# Patient Record
Sex: Male | Born: 1960 | Race: White | Hispanic: No | Marital: Single | Smoking: Never smoker
Health system: Southern US, Community
[De-identification: ages and names within clinical notes are randomized; demographics above are authoritative.]

## PROBLEM LIST (undated history)

## (undated) DIAGNOSIS — G43909 Migraine, unspecified, not intractable, without status migrainosus: Secondary | ICD-10-CM

## (undated) DIAGNOSIS — T7840XA Allergy, unspecified, initial encounter: Secondary | ICD-10-CM

## (undated) DIAGNOSIS — M720 Palmar fascial fibromatosis [Dupuytren]: Secondary | ICD-10-CM

## (undated) HISTORY — DX: Palmar fascial fibromatosis (dupuytren): M72.0

## (undated) HISTORY — DX: Allergy, unspecified, initial encounter: T78.40XA

## (undated) HISTORY — PX: OTHER SURGICAL HISTORY: SHX169

## (undated) HISTORY — DX: Migraine, unspecified, not intractable, without status migrainosus: G43.909

---

## 2009-01-08 ENCOUNTER — Emergency Department: Payer: Self-pay | Admitting: Internal Medicine

## 2010-07-21 ENCOUNTER — Ambulatory Visit: Payer: Self-pay | Admitting: Internal Medicine

## 2010-08-11 ENCOUNTER — Encounter: Payer: Self-pay | Admitting: Specialist

## 2010-09-02 ENCOUNTER — Encounter: Payer: Self-pay | Admitting: Specialist

## 2011-11-12 ENCOUNTER — Ambulatory Visit: Payer: Self-pay | Admitting: Internal Medicine

## 2012-07-07 ENCOUNTER — Ambulatory Visit: Payer: Self-pay | Admitting: Family Medicine

## 2012-11-01 ENCOUNTER — Ambulatory Visit: Payer: Self-pay | Admitting: Family Medicine

## 2012-11-24 ENCOUNTER — Ambulatory Visit: Payer: Self-pay | Admitting: Specialist

## 2013-11-23 ENCOUNTER — Ambulatory Visit: Payer: Self-pay

## 2013-11-23 LAB — RAPID INFLUENZA A&B ANTIGENS

## 2015-09-19 DIAGNOSIS — S6390XA Sprain of unspecified part of unspecified wrist and hand, initial encounter: Secondary | ICD-10-CM | POA: Insufficient documentation

## 2015-09-19 DIAGNOSIS — M720 Palmar fascial fibromatosis [Dupuytren]: Secondary | ICD-10-CM | POA: Insufficient documentation

## 2016-04-22 ENCOUNTER — Encounter: Payer: Self-pay | Admitting: Unknown Physician Specialty

## 2016-04-22 ENCOUNTER — Ambulatory Visit (INDEPENDENT_AMBULATORY_CARE_PROVIDER_SITE_OTHER): Payer: 59 | Admitting: Unknown Physician Specialty

## 2016-04-22 VITALS — BP 133/82 | HR 56 | Temp 98.1°F | Ht 69.7 in | Wt 163.4 lb

## 2016-04-22 DIAGNOSIS — M720 Palmar fascial fibromatosis [Dupuytren]: Secondary | ICD-10-CM

## 2016-04-22 DIAGNOSIS — M7661 Achilles tendinitis, right leg: Secondary | ICD-10-CM | POA: Insufficient documentation

## 2016-04-22 NOTE — Progress Notes (Signed)
   BP 133/82 mmHg  Pulse 56  Temp(Src) 98.1 F (36.7 C)  Ht 5' 9.7" (1.77 m)  Wt 163 lb 6.4 oz (74.118 kg)  BMI 23.66 kg/m2  SpO2 100%   Subjective:    Patient ID: Stanley Ritter, male    DOB: 09/29/1961, 55 y.o.   MRN: [id number]  HPI: Stanley CanningRobert C Dunaway is a 55 y.o. male  Chief Complaint  Patient presents with  . Pain    pt states he is having pain in achilles area of right foot and around to the top of foot. 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  Started having pain in right foot suddenly 3 days ago and found himself unable to stand.  Today having pain along the achilles tendon track.  Just sitting there no pain, but starts hurting when walking on it.    Relevant past medical, surgical, family and social history reviewed and updated as indicated. Interim medical history since our last visit reviewed. Allergies and medications reviewed and updated.  Review of Systems  Per HPI unless specifically indicated above     Objective:    BP 133/82 mmHg  Pulse 56  Temp(Src) 98.1 F (36.7 C)  Ht 5' 9.7" (1.77 m)  Wt 163 lb 6.4 oz (74.118 kg)  BMI 23.66 kg/m2  SpO2 100%  Wt Readings from Last 3 Encounters:  04/22/16 163 lb 6.4 oz (74.118 kg)  01/10/15 159 lb (72.122 kg)    Physical Exam  Constitutional: He is oriented to person, place, and time. He appears well-developed and well-nourished. No distress.  HENT:  Head: Normocephalic and atraumatic.  Eyes: Conjunctivae and lids are normal. Right eye exhibits no discharge. Left eye exhibits no discharge. No scleral icterus.  Cardiovascular: Normal rate.   Pulmonary/Chest: Effort normal.  Abdominal: Normal appearance. There is no splenomegaly or hepatomegaly.  Musculoskeletal: Normal range of motion.       Right ankle: He exhibits normal range of motion, no swelling, no ecchymosis, no deformity, no laceration and  normal pulse. Tenderness. Achilles tendon exhibits pain. Achilles tendon exhibits no defect.  Tender at achilles insertion sites  Neurological: He is alert and oriented to person, place, and time.  Skin: Skin is intact. No rash noted. No pallor.  Psychiatric: He has a normal mood and affect. His behavior is normal. Judgment and thought content normal.       Assessment & Plan:   Problem List Items Addressed This Visit      Unprioritized   Tendonitis, Achilles, right - Primary    No swelling and I don't suspect a rupture.  Will decrease training and note to be out of work for a week.            Follow up plan: Return in about 1 week (around 04/29/2016).

## 2016-04-22 NOTE — Assessment & Plan Note (Signed)
No swelling and I don't suspect a rupture.  Will decrease training and note to be out of work for a week.

## 2016-04-29 ENCOUNTER — Ambulatory Visit (INDEPENDENT_AMBULATORY_CARE_PROVIDER_SITE_OTHER): Payer: 59 | Admitting: Unknown Physician Specialty

## 2016-04-29 ENCOUNTER — Encounter: Payer: Self-pay | Admitting: Unknown Physician Specialty

## 2016-04-29 VITALS — BP 140/91 | HR 67 | Temp 97.6°F | Ht 70.0 in | Wt 163.2 lb

## 2016-04-29 DIAGNOSIS — M7661 Achilles tendinitis, right leg: Secondary | ICD-10-CM | POA: Diagnosis not present

## 2016-04-29 NOTE — Progress Notes (Signed)
BP 140/91 mmHg  Pulse 67  Temp(Src) 97.6 F (36.4 C)  Ht 5\' 10"  (1.778 m)  Wt 163 lb 3.2 oz (74.027 kg)  BMI 23.42 kg/m2  SpO2 98%   Subjective:    Patient ID: Stanley Ritter, male    DOB: 12/25/1960, 55 y.o.   MRN: [id number]  HPI: Stanley Ritter is a 55 y.o. male  Chief Complaint  Patient presents with  . Tendonitis    pt is here for 1 week f/up    Pt is here for f/u of tendonitis.  Pt states he is doing better.  However his medial ankle is bothering and he now remembers that the achilles tendonitis started after treatment of medial tibial tendonitis and ? Stress fracture of medial malleolus.  He states the achilles is better but has done nothing all week.    Relevant past medical, surgical, family and social history reviewed and updated as indicated. Interim medical history since our last visit reviewed. Allergies and medications reviewed and updated.  Review of Systems  Per HPI unless specifically indicated above     Objective:    BP 140/91 mmHg  Pulse 67  Temp(Src) 97.6 F (36.4 C)  Ht 5\' 10"  (1.778 m)  Wt 163 lb 3.2 oz (74.027 kg)  BMI 23.42 kg/m2  SpO2 98%  Wt Readings from Last 3 Encounters:  04/29/16 163 lb 3.2 oz (74.027 kg)  04/22/16 163 lb 6.4 oz (74.118 kg)  01/10/15 159 lb (72.122 kg)    Physical Exam  Constitutional: He is oriented to person, place, and time. He appears well-developed and well-nourished. No distress.  HENT:  Head: Normocephalic and atraumatic.  Eyes: Conjunctivae and lids are normal. Right eye exhibits no discharge. Left eye exhibits no discharge. No scleral icterus.  Neck: Normal range of motion. Neck supple. No JVD present. Carotid bruit is not present.  Pulmonary/Chest: Effort normal and breath sounds normal. No respiratory distress.  Abdominal: Normal appearance. There is no splenomegaly or hepatomegaly.  Musculoskeletal: Normal range of motion.  Neurological: He is alert and oriented to person, place, and time.  Skin:  Skin is warm, dry and intact. No rash noted. No pallor.  Psychiatric: He has a normal mood and affect. His behavior is normal. Judgment and thought content normal.  Filled out paperwork   Results for orders placed or performed in visit on 11/23/13  Influenza A&B Antigens [facility])  Result Value Ref Range   Micro Text Report         SOURCE: NASO    COMMENT                   POSITIVE FOR INFLUENZA A (ANTIGEN PRESENT)   COMMENT                   NEGATIVE FOR INFLUENZA B (ANTIGEN ABSENT)   ANTIBIOTIC                                                          Assessment & Plan:   Problem List Items Addressed This Visit      Unprioritized   Tendonitis, Achilles, right - Primary   Relevant Orders   Ambulatory referral to Physical Therapy       Follow up plan: Return for refer to PT.

## 2016-04-30 ENCOUNTER — Telehealth: Payer: Self-pay | Admitting: Unknown Physician Specialty

## 2016-04-30 NOTE — Telephone Encounter (Signed)
Pt called stated he needs a referral to see a physical therapist about his foot. [facility] [place] Please call pt with any questions. Office fax # [phone] for PT. Thanks

## 2016-05-01 NOTE — Telephone Encounter (Signed)
Routing to provider  

## 2016-05-01 NOTE — Telephone Encounter (Signed)
Routing to referrals

## 2016-05-01 NOTE — Telephone Encounter (Signed)
I think I did a referral when I saw him

## 2016-11-06 ENCOUNTER — Ambulatory Visit: Payer: 59 | Admitting: Family Medicine

## 2016-11-17 ENCOUNTER — Encounter: Payer: Self-pay | Admitting: Family Medicine

## 2016-11-17 ENCOUNTER — Ambulatory Visit (INDEPENDENT_AMBULATORY_CARE_PROVIDER_SITE_OTHER): Payer: 59 | Admitting: Family Medicine

## 2016-11-17 DIAGNOSIS — S060X0A Concussion without loss of consciousness, initial encounter: Secondary | ICD-10-CM | POA: Diagnosis not present

## 2016-11-17 DIAGNOSIS — S060X9A Concussion with loss of consciousness of unspecified duration, initial encounter: Secondary | ICD-10-CM | POA: Insufficient documentation

## 2016-11-17 DIAGNOSIS — S060XAA Concussion with loss of consciousness status unknown, initial encounter: Secondary | ICD-10-CM

## 2016-11-17 HISTORY — DX: Concussion with loss of consciousness status unknown, initial encounter: S06.0XAA

## 2016-11-17 NOTE — Assessment & Plan Note (Addendum)
Discuss concussion care and considerations will stay out of work until dizziness resolves because of ladder climbing required. May need further evaluation or longer staying out of work until symptoms resolve.

## 2016-11-17 NOTE — Progress Notes (Signed)
   BP 117/75   Pulse 67   Temp 98.6 F (37 C) (Oral)   Ht 5\' 9"  (1.753 m)   Wt 166 lb (75.3 kg)   SpO2 99%   BMI 24.51 kg/m    Subjective:    Patient ID: Stanley Ritter, male    DOB: 10/19/1961, 56 y.o.   MRN: [id number]  HPI: Stanley Ritter is a 56 y.o. male  Chief Complaint  Patient presents with  . Hospitalization Follow-up  . Concussion    Migraines, dizziness   Patient with head injury from fall while ice skating January 2 still having some issues with dizziness lightheaded patient works in a job with Therapist, musicclimbing ladders. Still having some migraine headaches also along with dizziness.  Relevant past medical, surgical, family and social history reviewed and updated as indicated. Interim medical history since our last visit reviewed. Allergies and medications reviewed and updated.  Review of Systems  Constitutional: Negative.   Respiratory: Negative.   Cardiovascular: Negative.     Per HPI unless specifically indicated above     Objective:    BP 117/75   Pulse 67   Temp 98.6 F (37 C) (Oral)   Ht 5\' 9"  (1.753 m)   Wt 166 lb (75.3 kg)   SpO2 99%   BMI 24.51 kg/m   Wt Readings from Last 3 Encounters:  11/17/16 166 lb (75.3 kg)  04/29/16 163 lb 3.2 oz (74 kg)  04/22/16 163 lb 6.4 oz (74.1 kg)    Physical Exam  Constitutional: He is oriented to person, place, and time. He appears well-developed and well-nourished. No distress.  HENT:  Head: Normocephalic and atraumatic.  Right Ear: Hearing normal.  Left Ear: Hearing normal.  Nose: Nose normal.  Eyes: Conjunctivae and lids are normal. Right eye exhibits no discharge. Left eye exhibits no discharge. No scleral icterus.  Cardiovascular: Normal rate, regular rhythm and normal heart sounds.   Pulmonary/Chest: Effort normal and breath sounds normal. No respiratory distress.  Musculoskeletal: Normal range of motion.  Neurological: He is alert and oriented to person, place, and time. He displays normal reflexes.  No cranial nerve deficit. He exhibits normal muscle tone. Coordination normal.  Skin: Skin is intact. No rash noted.  Psychiatric: He has a normal mood and affect. His speech is normal and behavior is normal. Judgment and thought content normal. Cognition and memory are normal.        Assessment & Plan:   Problem List Items Addressed This Visit      Other   Concussion    Discuss concussion care and considerations will stay out of work until dizziness resolves because of ladder climbing required. May need further evaluation or longer staying out of work until symptoms resolve.          Follow up plan: Return if symptoms worsen or fail to improve.

## 2016-11-23 ENCOUNTER — Telehealth: Payer: Self-pay | Admitting: Family Medicine

## 2016-11-23 NOTE — Telephone Encounter (Signed)
OK to give work note from 1/16-1/23- if he is still having issues with staying awake, will need to be seen in follow up ASAP

## 2016-11-23 NOTE — Telephone Encounter (Signed)
Letter composed and faxed to [phone] Attn: Plant Nurse. At patient's request. He will call tomorrow for an acute visit if still not feeling better.

## 2016-11-23 NOTE — Telephone Encounter (Signed)
OV from 11/17/16 was reviewed did not see any mention of alertness issues. A&P did however state, " Concussion     Discuss concussion care and considerations will stay out of work until dizziness resolves because of ladder climbing required. May need further evaluation or longer staying out of work until symptoms resolve.    "  Please advise on work note.

## 2016-11-23 NOTE — Telephone Encounter (Signed)
Pt called and stated that he would like to get a note for work. He is unable to stay awake for more than 3 hours. See office visit 11/17/16.

## 2016-11-28 ENCOUNTER — Encounter: Payer: Self-pay | Admitting: Family Medicine

## 2016-11-30 NOTE — Telephone Encounter (Signed)
Please see message from patient. Pt requested a note on 11/23/16, Dr. Laural BenesJohnson did okay writing note from 11/17/16-11/24/16 and advised that patient should follow up after 11/24/16 if he continued to have issues staying awake. Pt now requesting short term disability form to be completed, please advise.

## 2017-09-06 ENCOUNTER — Encounter: Payer: Self-pay | Admitting: Unknown Physician Specialty

## 2017-09-06 ENCOUNTER — Ambulatory Visit (INDEPENDENT_AMBULATORY_CARE_PROVIDER_SITE_OTHER): Payer: 59 | Admitting: Unknown Physician Specialty

## 2017-09-06 VITALS — BP 119/78 | HR 80 | Temp 99.1°F | Wt 168.0 lb

## 2017-09-06 DIAGNOSIS — R05 Cough: Secondary | ICD-10-CM | POA: Diagnosis not present

## 2017-09-06 DIAGNOSIS — R059 Cough, unspecified: Secondary | ICD-10-CM

## 2017-09-06 LAB — VERITOR FLU A/B WAIVED
Influenza A: NEGATIVE
Influenza B: NEGATIVE

## 2017-09-06 MED ORDER — AZITHROMYCIN 250 MG PO TABS: ORAL_TABLET | ORAL | 0 refills | Status: DC

## 2017-09-06 MED ORDER — HYDROCOD POLST-CPM POLST ER 10-8 MG/5ML PO SUER: 5.0000 mL | Freq: Two times a day (BID) | ORAL | 0 refills | Status: DC | PRN

## 2017-09-06 NOTE — Progress Notes (Signed)
   BP 119/78   Pulse 80   Temp 99.1 F (37.3 C) (Oral)   Wt 168 lb (76.2 kg)   SpO2 99%   BMI 24.81 kg/m    Subjective:    Patient ID: Stanley Ritter, male    DOB: 01/30/1961, 56 y.o.   MRN: [id number]  HPI: Stanley CanningRobert C Ellis is a 56 y.o. male  Chief Complaint  Patient presents with  . URI    pt states he has had a cough since last Wednesday. States he started feeling really bad 2 days ago    URI   This is a new problem. The current episode started in the past 7 days. The problem has been rapidly worsening. Maximum temperature: Did not check. Associated symptoms include congestion and coughing. Associated symptoms comments: Chills.  Cough is severe. He has tried decongestant and antihistamine (cough meds) for the symptoms.    Relevant past medical, surgical, family and social history reviewed and updated as indicated. Interim medical history since our last visit reviewed. Allergies and medications reviewed and updated.  Review of Systems  HENT: Positive for congestion.   Respiratory: Positive for cough.     Per HPI unless specifically indicated above     Objective:    BP 119/78   Pulse 80   Temp 99.1 F (37.3 C) (Oral)   Wt 168 lb (76.2 kg)   SpO2 99%   BMI 24.81 kg/m   Wt Readings from Last 3 Encounters:  09/06/17 168 lb (76.2 kg)  11/17/16 166 lb (75.3 kg)  04/29/16 163 lb 3.2 oz (74 kg)    Physical Exam  Constitutional: He is oriented to person, place, and time. He appears well-developed and well-nourished. No distress.  HENT:  Head: Normocephalic and atraumatic.  Right Ear: Tympanic membrane and ear canal normal.  Left Ear: Tympanic membrane and ear canal normal.  Nose: Rhinorrhea present. Right sinus exhibits no maxillary sinus tenderness and no frontal sinus tenderness. Left sinus exhibits no maxillary sinus tenderness and no frontal sinus tenderness.  Mouth/Throat: Uvula is midline. Posterior oropharyngeal edema present.  Eyes: Conjunctivae and lids are  normal. Right eye exhibits no discharge. Left eye exhibits no discharge. No scleral icterus.  Neck: Neck supple.  Cardiovascular: Normal rate, regular rhythm and normal heart sounds.  Pulmonary/Chest: Effort normal and breath sounds normal. No respiratory distress.  Abdominal: Normal appearance. There is no splenomegaly or hepatomegaly.  Musculoskeletal: Normal range of motion.  Neurological: He is alert and oriented to person, place, and time.  Skin: Skin is warm, dry and intact. No rash noted. No pallor.  Psychiatric: He has a normal mood and affect. His behavior is normal. Judgment and thought content normal.  Nursing note and vitals reviewed.   Results for orders placed or performed in visit on 09/06/17  Veritor Flu A/B Waived  Result Value Ref Range   Influenza A Negative Negative   Influenza B Negative Negative      Assessment & Plan:   Problem List Items Addressed This Visit    None    Visit Diagnoses    Cough    -  Primary   Rx for Zithromax due to fever and negative flu.  Rx for Tussinex for cough.     Relevant Orders   Veritor Flu A/B Waived (Completed)      Note to be out of work until Thursday  Follow up plan: Return if symptoms worsen or fail to improve.

## 2017-09-08 ENCOUNTER — Ambulatory Visit
Admission: RE | Admit: 2017-09-08 | Discharge: 2017-09-08 | Disposition: A | Discharge status: Home / Self Care | Payer: 59 | Source: Ambulatory Visit | Attending: Unknown Physician Specialty | Admitting: Unknown Physician Specialty

## 2017-09-08 ENCOUNTER — Encounter: Payer: Self-pay | Admitting: Unknown Physician Specialty

## 2017-09-08 ENCOUNTER — Ambulatory Visit (INDEPENDENT_AMBULATORY_CARE_PROVIDER_SITE_OTHER): Payer: 59 | Admitting: Unknown Physician Specialty

## 2017-09-08 VITALS — BP 121/84 | HR 68 | Temp 98.3°F | Wt 165.2 lb

## 2017-09-08 DIAGNOSIS — J111 Influenza due to unidentified influenza virus with other respiratory manifestations: Secondary | ICD-10-CM

## 2017-09-08 DIAGNOSIS — R05 Cough: Secondary | ICD-10-CM | POA: Insufficient documentation

## 2017-09-08 DIAGNOSIS — R059 Cough, unspecified: Secondary | ICD-10-CM

## 2017-09-08 MED ORDER — ALBUTEROL SULFATE HFA 108 (90 BASE) MCG/ACT IN AERS: 2.0000 | INHALATION_SPRAY | Freq: Four times a day (QID) | RESPIRATORY_TRACT | 0 refills | Status: DC | PRN

## 2017-09-08 NOTE — Progress Notes (Signed)
   BP 121/84   Pulse 68   Temp 98.3 F (36.8 C) (Oral)   Wt 165 lb 3.2 oz (74.9 kg)   SpO2 98%   BMI 24.40 kg/m    Subjective:    Patient ID: Stanley Ritter, male    DOB: 12/06/1960, [age] y.o.   MRN: [id number]  HPI: Stanley Ritter is a 56 y.o. male  Chief Complaint  Patient presents with  . URI    2 day f/up.Marland Kitchen.pt states he is doing a little better   Seen 11/5 Pt states he is doing a little better.  He states he has had a fever that last 2 days but seemed to break this AM.  I gave him a Z pack last visit.    Relevant past medical, surgical, family and social history reviewed and updated as indicated. Interim medical history since our last visit reviewed. Allergies and medications reviewed and updated.  Review of Systems  Per HPI unless specifically indicated above     Objective:    BP 121/84   Pulse 68   Temp 98.3 F (36.8 C) (Oral)   Wt 165 lb 3.2 oz (74.9 kg)   SpO2 98%   BMI 24.40 kg/m   Wt Readings from Last 3 Encounters:  09/08/17 165 lb 3.2 oz (74.9 kg)  09/06/17 168 lb (76.2 kg)  11/17/16 166 lb (75.3 kg)    Physical Exam  Constitutional: He is oriented to person, place, and time. He appears well-developed and well-nourished. No distress.  HENT:  Head: Normocephalic and atraumatic.  Right Ear: Tympanic membrane and ear canal normal.  Left Ear: Tympanic membrane and ear canal normal.  Nose: Rhinorrhea present. Right sinus exhibits no maxillary sinus tenderness and no frontal sinus tenderness. Left sinus exhibits no maxillary sinus tenderness and no frontal sinus tenderness.  Mouth/Throat: Uvula is midline. Posterior oropharyngeal edema present.  Eyes: Conjunctivae and lids are normal. Right eye exhibits no discharge. Left eye exhibits no discharge. No scleral icterus.  Neck: Neck supple.  Cardiovascular: Normal rate, regular rhythm and normal heart sounds.  Pulmonary/Chest: Effort normal. No respiratory distress. He has wheezes in the right lower field and  the left lower field.  Abdominal: Normal appearance. There is no splenomegaly or hepatomegaly.  Musculoskeletal: Normal range of motion.  Neurological: He is alert and oriented to person, place, and time.  Skin: Skin is warm, dry and intact. No rash noted. No pallor.  Psychiatric: He has a normal mood and affect. His behavior is normal. Judgment and thought content normal.  Nursing note and vitals reviewed.   Results for orders placed or performed in visit on 09/06/17  Veritor Flu A/B Waived  Result Value Ref Range   Influenza A Negative Negative   Influenza B Negative Negative      Assessment & Plan:   Problem List Items Addressed This Visit    None    Visit Diagnoses    Cough    -  Primary   Continue woth Tussionex prn.  Add Albuterol prn due to wheeze   Relevant Orders   DG Chest 2 View   Influenza       suspect influenza.  discussed supportive care       Follow up plan: Return if symptoms worsen or fail to improve.

## 2018-07-13 ENCOUNTER — Ambulatory Visit (INDEPENDENT_AMBULATORY_CARE_PROVIDER_SITE_OTHER): Payer: 59 | Admitting: Family Medicine

## 2018-07-13 ENCOUNTER — Encounter: Payer: Self-pay | Admitting: Family Medicine

## 2018-07-13 VITALS — BP 146/89 | HR 71 | Wt 167.0 lb

## 2018-07-13 DIAGNOSIS — S76212A Strain of adductor muscle, fascia and tendon of left thigh, initial encounter: Secondary | ICD-10-CM

## 2018-07-13 MED ORDER — TRIAMCINOLONE ACETONIDE 40 MG/ML IJ SUSP
40.0000 mg | Freq: Once | INTRAMUSCULAR | Status: AC
  Administered 2018-07-13: 40 mg via INTRAMUSCULAR

## 2018-07-13 MED ORDER — TRAMADOL HCL 50 MG PO TABS: 50.0000 mg | ORAL_TABLET | Freq: Every evening | ORAL | 0 refills | Status: DC | PRN

## 2018-07-13 MED ORDER — CYCLOBENZAPRINE HCL 10 MG PO TABS: 10.0000 mg | ORAL_TABLET | Freq: Every evening | ORAL | 0 refills | Status: DC | PRN

## 2018-07-13 NOTE — Progress Notes (Signed)
   BP (!) 146/89   Pulse 71   Wt 167 lb (75.8 kg)   SpO2 100%   BMI 24.66 kg/m    Subjective:    Patient ID: Stanley Ritter, male    DOB: July 16, 1961, 57 y.o.   MRN: [id number]  HPI: [name] is a 57 y.o. male  Chief Complaint  Patient presents with  . Back Pain  . 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 Denies bruising, swelling. Low back originally hurt but that has resolved with rest. Taking advil, doing epsom salt soaks, and resting with some relief.   Relevant past medical, surgical, family and social history reviewed and updated as indicated. Interim medical history since our last visit reviewed. Allergies and medications reviewed and updated.  Review of Systems  Per HPI unless specifically indicated above     Objective:    BP (!) 146/89   Pulse 71   Wt 167 lb (75.8 kg)   SpO2 100%   BMI 24.66 kg/m   Wt Readings from Last 3 Encounters:  07/13/18 167 lb (75.8 kg)  09/08/17 165 lb 3.2 oz (74.9 kg)  09/06/17 168 lb (76.2 kg)    Physical Exam  Constitutional: He is oriented to person, place, and time. He appears well-developed and well-nourished.  HENT:  Head: Atraumatic.  Eyes: Conjunctivae and EOM are normal.  Neck: Normal range of motion. Neck supple.  Cardiovascular: Normal rate and regular rhythm.  Pulmonary/Chest: Effort normal and breath sounds normal.  Musculoskeletal: Normal range of motion. He exhibits tenderness (left groin ttp extending laterally into left hip). He exhibits no edema.  Neurological: He is alert and oriented to person, place, and time.  Skin: Skin is warm and dry. No erythema.  Nursing note and vitals reviewed.   Results for orders placed or performed in visit on 09/06/17  Veritor Flu A/B Waived  Result Value Ref Range   Influenza A  Negative Negative   Influenza B Negative Negative      Assessment & Plan:   Problem List Items Addressed This Visit    None    Visit Diagnoses    Groin strain, left, initial encounter    -  Primary   IM kenalog today, flexeril prn as well as rest, stretches, massage. Can continue prn OTC pain relievers and use tramadol prn for severe pain   Relevant Medications   triamcinolone acetonide (KENALOG-40) injection 40 mg (Completed)       Follow up plan: Return if symptoms worsen or fail to improve.

## 2018-07-24 NOTE — Patient Instructions (Signed)
Follow up as needed

## 2018-11-09 ENCOUNTER — Ambulatory Visit: Payer: 59 | Admitting: Nurse Practitioner

## 2019-01-12 ENCOUNTER — Encounter: Payer: Self-pay | Admitting: Emergency Medicine

## 2019-01-12 ENCOUNTER — Ambulatory Visit
Admission: EM | Admit: 2019-01-12 | Discharge: 2019-01-12 | Disposition: A | Discharge status: Home / Self Care | Payer: 59 | Attending: Emergency Medicine | Admitting: Emergency Medicine

## 2019-01-12 ENCOUNTER — Other Ambulatory Visit: Payer: Self-pay

## 2019-01-12 ENCOUNTER — Ambulatory Visit: Payer: Self-pay | Admitting: *Deleted

## 2019-01-12 DIAGNOSIS — R509 Fever, unspecified: Secondary | ICD-10-CM

## 2019-01-12 DIAGNOSIS — B9789 Other viral agents as the cause of diseases classified elsewhere: Secondary | ICD-10-CM | POA: Diagnosis not present

## 2019-01-12 DIAGNOSIS — M791 Myalgia, unspecified site: Secondary | ICD-10-CM | POA: Diagnosis not present

## 2019-01-12 DIAGNOSIS — R69 Illness, unspecified: Secondary | ICD-10-CM | POA: Diagnosis not present

## 2019-01-12 DIAGNOSIS — J111 Influenza due to unidentified influenza virus with other respiratory manifestations: Secondary | ICD-10-CM

## 2019-01-12 DIAGNOSIS — R6883 Chills (without fever): Secondary | ICD-10-CM | POA: Diagnosis not present

## 2019-01-12 LAB — RAPID INFLUENZA A&B ANTIGENS (ARMC ONLY)
INFLUENZA A (ARMC): NEGATIVE
INFLUENZA B (ARMC): NEGATIVE

## 2019-01-12 MED ORDER — OSELTAMIVIR PHOSPHATE 75 MG PO CAPS: 75.0000 mg | ORAL_CAPSULE | Freq: Two times a day (BID) | ORAL | 0 refills | Status: DC

## 2019-01-12 NOTE — ED Provider Notes (Signed)
[facility]    CSN: [id number] Arrival date & time: 01/12/19  1359     History   Chief Complaint Chief Complaint  Patient presents with  . Generalized Body Aches  . Fever    HPI Stanley Ritter is a 58 y.o. male.   Patient is a 58 year old male who presents complaining of chills and fever that started 2 days ago.  Patient reports she has had flu this season already but states symptoms with a little bit different.  Reports body aches in the back and calves.  Minimal cough that is nonproductive.  Patient denies any sneezing, runny nose or sore throat.  Patient reports a temperature 101.3 last night which she took ibuprofen for.  States he had a temp of 100.3 this morning.  States she is only taking medications for temperatures above 101.  Patient denies any chest pain or shortness of breath.  And responsive his back pain, patient denies any frequency, painful urination or other urinary symptoms.  Patient does report that he was part of a figure skating competition this past weekend in [place].  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  Patient has had no other sick contacts.     Past Medical History:  Diagnosis Date  . Allergy   . Dupuytren's contracture   . Migraines     Patient Active Problem List   Diagnosis Date Noted  . Concussion 11/17/2016  . Tendonitis, Achilles, right 04/22/2016  . Dupuytren's contracture 09/19/2015  . Sprain of hand 09/19/2015    Past Surgical History:  Procedure Laterality Date  . dental implant     x2       Home Medications    Prior to Admission medications   Medication Sig Start Date End Date Taking? Authorizing Provider  Coenzyme Q10 (CO Q 10 PO) Take 1 tablet by mouth daily.   Yes [provider]  Multiple Vitamin (MULTI-VITAMINS) TABS Take 1 tablet by mouth daily.   Yes [provider]  Omega-3 Fatty Acids (FISH OIL PO) Take by mouth daily.   Yes [provider]  triamcinolone  (NASACORT AQ) 55 MCG/ACT AERO nasal inhaler Place 2 sprays into the nose daily.   Yes [provider]  albuterol (PROVENTIL HFA;VENTOLIN HFA) 108 (90 Base) MCG/ACT inhaler Inhale 2 puffs every 6 (six) hours as needed into the lungs for wheezing or shortness of breath. 09/08/17   Gabriel Cirri, NP  cyclobenzaprine (FLEXERIL) 10 MG tablet Take 1 tablet (10 mg total) by mouth at bedtime as needed for muscle spasms. 07/13/18   Particia Nearing, PA-C  oseltamivir (TAMIFLU) 75 MG capsule Take 1 capsule (75 mg total) by mouth every 12 (twelve) hours. 01/12/19   Candis Schatz, PA-C  SUMAtriptan (IMITREX) 100 MG tablet Take 100 mg by mouth every 2 (two) hours as needed for migraine. May repeat in 2 hours if headache persists or recurs.    [provider]  traMADol (ULTRAM) 50 MG tablet Take 1 tablet (50 mg total) by mouth at bedtime as needed. 07/13/18   Particia Nearing, PA-C    Family History Family History  Problem Relation Age of Onset  . Stroke Maternal Grandmother   . Heart disease Mother   . Glaucoma Mother   . Hypertension Mother   . Heart disease Father   . Glaucoma Father   . Hypertension Father   . Hypertension Sister   . Hypertension Brother   . Diabetes Maternal Grandfather   .  Hypertension Brother     Social History Social History   Tobacco Use  . Smoking status: Never Smoker  . Smokeless tobacco: Never Used  Substance Use Topics  . Alcohol use: No    Alcohol/week: 0.0 standard drinks  . Drug use: No     Allergies   Patient has no known allergies.   Review of Systems Review of Systems  As noted above in HPI.  Other systems reviewed and found to be negative.   Physical Exam Triage Vital Signs ED Triage Vitals  Enc Vitals Group     BP 01/12/19 1458 (!) 120/95     Pulse Rate 01/12/19 1458 70     Resp 01/12/19 1458 16     Temp 01/12/19 1458 98.6 F (37 C)     Temp Source 01/12/19 1458 Oral     SpO2 01/12/19 1458 99 %     Weight  01/12/19 1459 163 lb (73.9 kg)     Height 01/12/19 1459 5\' 9"  (1.753 m)     Head Circumference --      Peak Flow --      Pain Score 01/12/19 1458 4     Pain Loc --      Pain Edu? --      Excl. in GC? --    No data found.  Updated Vital Signs BP (!) 120/95 (BP Location: Left Arm)   Pulse 70   Temp 98.6 F (37 C) (Oral)   Resp 16   Ht 5\' 9"  (1.753 m)   Wt 163 lb (73.9 kg)   SpO2 99%   BMI 24.07 kg/m   Visual Acuity Right Eye Distance:   Left Eye Distance:   Bilateral Distance:    Right Eye Near:   Left Eye Near:    Bilateral Near:     Physical Exam Constitutional:      Appearance: Normal appearance. He is ill-appearing.  HENT:     Right Ear: Tympanic membrane and ear canal normal.     Left Ear: Tympanic membrane and ear canal normal.     Mouth/Throat:     Mouth: Mucous membranes are moist.  Eyes:     Extraocular Movements: Extraocular movements intact.  Cardiovascular:     Rate and Rhythm: Normal rate and regular rhythm.     Pulses: Normal pulses.     Heart sounds: Normal heart sounds.  Pulmonary:     Effort: Pulmonary effort is normal.     Breath sounds: Normal breath sounds.  Musculoskeletal: Normal range of motion.  Skin:    General: Skin is warm and dry.  Neurological:     General: No focal deficit present.     Mental Status: He is alert and oriented to person, place, and time.  Psychiatric:        Mood and Affect: Mood normal.        Behavior: Behavior normal.      UC Treatments / Results  Labs (all labs ordered are listed, but only abnormal results are displayed) Labs Reviewed  RAPID INFLUENZA A&B ANTIGENS ([facility] ONLY)    EKG None  Radiology No results found.  Procedures Procedures (including critical care time)  Medications Ordered in UC Medications - No data to display  Initial Impression / Assessment and Plan / UC Course  I have reviewed the triage vital signs and the nursing notes.  Pertinent labs & imaging results that were  available during my care of the patient were reviewed by me and  considered in my medical decision making (see chart for details).    Patient with flulike symptoms for 2 to 3 days.  Patient also reports being part of a skating competition this past weekend in [place].  CDC website was accessed showing 16 confirmed patients in [place] of the Covid-19.  [place] infection prevention was contacted in regards to the patient, without major respiratory symptoms, she advised to treat patient as normally would be treated and advised them to return should they develop any worsening respiratory symptoms.  Will give patient prescription again for Tamiflu even though his rapid flu was negative.  This be based on symptomology.  Final Clinical Impressions(s) / UC Diagnoses   Final diagnoses:  Influenza-like illness     Discharge Instructions     -Tamiflu: 1 tablet twice a day for 5 days -Continue ibuprofen or Tylenol as needed for pain and fever -Push fluids -Based on symptomology and recent travel, recommend that should you have further worsening of respiratory symptoms that you return to the clinic or the ER for further testing.    ED Prescriptions    Medication Sig Dispense Auth. Provider   oseltamivir (TAMIFLU) 75 MG capsule Take 1 capsule (75 mg total) by mouth every 12 (twelve) hours. 10 capsule Candis Schatz, PA-C     Controlled Substance Prescriptions [place] Controlled Substance Registry consulted? Not Applicable   Candis Schatz, PA-C 01/12/19 2021

## 2019-01-12 NOTE — Telephone Encounter (Signed)
Pt reports temp. 101.3 this am. Onset 2 days ago after returning from trip to [place] (drove). States temp has been fluctuating, low grade 100.3-100.5 until this AM, Max 101.3. HAs not taken anything for temp. Pt reports bilateral calf ache, "Soreness in calf muscles" and left shoulder "Aching like a cold in it." Denies any redness or swelling of calves, negative Homans, no SOB or increased pain with inspiration. Also reports lower back pain, no dysuria. States had something similar with calf pain in January when he tested positive for flu. No availability at practice, pt directed to UC. Care advise given per protocol. Verbalizes understanding.  Pt requesting work note as had to stay out of work today.  Reason for Disposition . [1] Fever > 101 F (38.3 C) AND [2] age > 60    Calf pain,  multiple symptoms  Answer Assessment - Initial Assessment Questions 1. TEMPERATURE: "What is the most recent temperature?"  "How was it measured?"      101.3 digitally 2. ONSET: "When did the fever start?"      2 days ago 3. SYMPTOMS: "Do you have any other symptoms besides the fever?"  (e.g., colds, headache, sore throat, earache, cough, rash, diarrhea, vomiting, abdominal pain)     Left shoulder achy, calf muscles sore (both) photophobia 4. CAUSE: If there are no symptoms, ask: "What do you think is causing the fever?"      N/A 5. CONTACTS: "Does anyone else in the family have an infection?"     no 6. TREATMENT: "What have you done so far to treat this fever?" (e.g., medications)     nothing 7. IMMUNOCOMPROMISE: "Do you have of the following: diabetes, HIV positive, splenectomy, cancer chemotherapy, chronic steroid treatment, transplant patient, etc."    no 9. TRAVEL: "Have you traveled out of the country in the last month?" (e.g., travel history, exposures)     no  Protocols used: FEVER-A-AH

## 2019-01-12 NOTE — ED Triage Notes (Signed)
Patient in today c/o body aches, fever (100.3-101.3) x 2 days. Patient states his eyes have been sensitive today. Patient has not tried any OTC medications. Patient's last dose of Ibuprofen was last night.

## 2019-01-12 NOTE — Discharge Instructions (Signed)
-  Tamiflu: 1 tablet twice a day for 5 days -Continue ibuprofen or Tylenol as needed for pain and fever -Push fluids -Based on symptomology and recent travel, recommend that should you have further worsening of respiratory symptoms that you return to the clinic or the ER for further testing.

## 2020-11-19 ENCOUNTER — Encounter: Payer: Self-pay | Admitting: Nurse Practitioner

## 2020-11-19 ENCOUNTER — Ambulatory Visit (INDEPENDENT_AMBULATORY_CARE_PROVIDER_SITE_OTHER): Payer: BC Managed Care – PPO | Admitting: Nurse Practitioner

## 2020-11-19 ENCOUNTER — Other Ambulatory Visit: Payer: Self-pay

## 2020-11-19 ENCOUNTER — Telehealth: Payer: Self-pay

## 2020-11-19 VITALS — BP 115/78 | HR 71 | Temp 98.1°F | Ht 69.09 in | Wt 162.4 lb

## 2020-11-19 DIAGNOSIS — R1011 Right upper quadrant pain: Secondary | ICD-10-CM | POA: Diagnosis not present

## 2020-11-19 NOTE — Assessment & Plan Note (Signed)
Acute x one episode, now improved with mild tenderness.  Will obtain labs today to include CBC, CMP, TSH, GGT.  Order for imaging obtained to further assess gall bladder and area approx 3 finger breadth above umbilicus.  Educated patient on diet changes at this time to avoid further acute events, but if they present or has worsening immediately notify office or go straight to ER.  Consider referral to GI or general surgery after review of labs and imaging.  Return in 2 weeks for follow-up, sooner if worsening episodes.

## 2020-11-19 NOTE — Patient Instructions (Signed)
Cholecystitis  Cholecystitis is irritation and swelling (inflammation) of the gallbladder. The gallbladder is an organ that is shaped like a pear. It is under the liver on the right side of the body. This organ stores bile. Bile helps the body break down (digest) the fats in food. This condition can occur all of a sudden. It needs to be treated. What are the causes? This condition may be caused by stones or lumps that form in the gallbladder (gallstones). Gallstones can block the tube (duct) that carries bile out of your gallbladder. Other causes are:  Damage to the gallbladder due to less blood flow.  Germs in the bile ducts.  Scars or kinks in the bile ducts.  Abnormal growths (tumors) in the liver, pancreas, or gallbladder. What increases the risk? You are more likely to develop this condition if:  You have sickle cell disease.  You take birth control pills.  You use estrogen.  You have alcoholic liver disease.  You have liver cirrhosis.  You are being fed through a vein.  You are very ill.  You do not eat or drink for a long time. This is also called "fasting."  You are overweight (obese).  You lose weight too fast.  You are pregnant.  You have high levels of fat in the blood (triglycerides).  You have irritation and swelling of the pancreas (pancreatitis). What are the signs or symptoms? Symptoms of this condition include:  Pain in the belly (abdomen). Pain is often in the upper right area of the belly.  Tenderness or bloating in the belly.  Feeling sick to your stomach (nauseous).  Throwing up (vomiting).  Fever.  Chills. How is this diagnosed? This condition may be diagnosed with a medical history and exam. You may also have other tests, such as:  Imaging tests. This may include: ? Ultrasound. ? CT scan of the belly. ? Nuclear scan. This is also called a HIDA scan. This scan lets your doctor see the bile as it moves in your body. ? MRI.  Blood  tests. These are done to check: ? Your blood count. The white blood cell count may be higher than normal. ? How well your liver works.   How is this treated? This condition may be treated with:  Surgery to take out your gallbladder.  Antibiotic medicines to treat illnesses caused by germs.  Going without food for some time.  Giving fluids through an IV tube.  Medicines to treat pain or throwing up. Follow these instructions at home:  If you had surgery, follow instructions from your doctor about how to care for yourself after you go home. Medicines  Take over-the-counter and prescription medicines only as told by your doctor.  If you were prescribed an antibiotic medicine, take it as told by your doctor. Do not stop taking it even if you start to feel better.   General instructions  Follow instructions from your doctor about what to eat or drink. Do not eat or drink anything that makes you sick again.  Do not lift anything that is heavier than 10 lb (4.5 kg) until your doctor says that it is safe.  Do not use any products that contain nicotine or tobacco, such as cigarettes and e-cigarettes. If you need help quitting, ask your doctor.  Keep all follow-up visits as told by your doctor. This is important. Contact a doctor if:  You have pain and your medicine does not help.  You have a fever. Get help right   away if:  Your pain moves to: ? Another part of your belly. ? Your back.  Your symptoms do not go away.  You have new symptoms. Summary  Cholecystitis is swelling and irritation of the gallbladder.  This condition may be caused by stones or lumps that form in the gallbladder (gallstones).  Common symptoms are pain in the belly. You may feel sick to your stomach and start throwing up. You may also have a fever and chills.  This condition may be treated with surgery to take out the gallbladder. It may also be treated with medicines, fasting, and fluids through an  IV tube.  Follow what you are told about eating and drinking. Do not eat things that make you sick again. This information is not intended to replace advice given to you by your health care provider. Make sure you discuss any questions you have with your health care provider. Document Revised: 02/25/2018 Document Reviewed: 02/25/2018 Elsevier Patient Education  2021 Elsevier Inc.  

## 2020-11-19 NOTE — Progress Notes (Signed)
BP 115/78   Pulse 71   Temp 98.1 F (36.7 C) (Oral)   Ht 5' 9.09" (1.755 m)   Wt 162 lb 6.4 oz (73.7 kg)   SpO2 99%   BMI 23.92 kg/m    Subjective:    Patient ID: Stanley Ritter, male    DOB: February 08, 1961, 60 y.o.   MRN: [id number]  HPI: Stanley Ritter is a 60 y.o. male  Chief Complaint  Patient presents with  . Abdominal Pain    Started on Right Upper Quad with stabbing pain on Saturday evening, has calmed down since   ABDOMINAL PAIN  Presents for abdominal pain RUQ which came out of nowhere on Saturday night -- stabbing pain.  On Sunday and Monday he relaxed and did nothing else.  When sitting up pain was not as bad. Reports at this time pain has improved, but is tenderness to RUQ. Still has gall bladder.    Does have history of back strain right side 3 weeks ago, but this improved.  Is an Location manager.  Non smoker and minimal alcohol use at home reported. Duration:x one episode Saturday Onset: sudden Severity: 7/10 when present, intense Quality: sharp and aching Location: RUQ Episode duration:  Radiation: no Frequency: constant lasting for about 1 hour, better with sitting up Alleviating factors: sitting up Aggravating factors: laying down Status: stable Treatments attempted: none Fever: no Nausea: no Vomiting: no Weight loss: no Decreased appetite: no Diarrhea: no Constipation: no Blood in stool: no Heartburn: no Jaundice: no Rash: no Dysuria/urinary frequency: no Hematuria: no History of sexually transmitted disease: no Recurrent NSAID use: no  Relevant past medical, surgical, family and social history reviewed and updated as indicated. Interim medical history since our last visit reviewed. Allergies and medications reviewed and updated.  Review of Systems  Constitutional: Negative for activity change, diaphoresis, fatigue and fever.  Respiratory: Negative for cough, chest tightness, shortness of breath and wheezing.   Cardiovascular: Negative for chest  pain, palpitations and leg swelling.  Gastrointestinal: Positive for abdominal pain (RUQ). Negative for abdominal distention, blood in stool, constipation, diarrhea, nausea and vomiting.  Endocrine: Negative for cold intolerance and heat intolerance.  Neurological: Negative.   Psychiatric/Behavioral: Negative.     Per HPI unless specifically indicated above     Objective:    BP 115/78   Pulse 71   Temp 98.1 F (36.7 C) (Oral)   Ht 5' 9.09" (1.755 m)   Wt 162 lb 6.4 oz (73.7 kg)   SpO2 99%   BMI 23.92 kg/m   Wt Readings from Last 3 Encounters:  11/19/20 162 lb 6.4 oz (73.7 kg)  01/12/19 163 lb (73.9 kg)  07/13/18 167 lb (75.8 kg)    Physical Exam Vitals and nursing note reviewed.  Constitutional:      General: He is awake. He is not in acute distress.    Appearance: He is well-developed and well-groomed. He is not ill-appearing or toxic-appearing.  HENT:     Head: Normocephalic and atraumatic.     Right Ear: Hearing normal. No drainage.     Left Ear: Hearing normal. No drainage.  Eyes:     General: Lids are normal.        Right eye: No discharge.        Left eye: No discharge.     Conjunctiva/sclera: Conjunctivae normal.     Pupils: Pupils are equal, round, and reactive to light.  Neck:     Thyroid: No thyromegaly.  Vascular: No carotid bruit.     Trachea: Trachea normal.  Cardiovascular:     Rate and Rhythm: Normal rate and regular rhythm.     Heart sounds: Normal heart sounds, S1 normal and S2 normal. No murmur heard. No gallop.   Pulmonary:     Effort: Pulmonary effort is normal. No accessory muscle usage or respiratory distress.     Breath sounds: Normal breath sounds.  Abdominal:     General: Bowel sounds are normal. There is no distension or abdominal bruit.     Palpations: Abdomen is soft. There is no hepatomegaly or splenomegaly.     Tenderness: There is abdominal tenderness in the right upper quadrant and periumbilical area. There is no right CVA  tenderness, left CVA tenderness, guarding or rebound. Negative signs include Murphy's sign.     Comments: Negative Murphy.  Moderate tenderness to approx 3 fingerbreadth above umbilicus where small cystic like, firm, round, mobile mass noted.  Musculoskeletal:        General: Normal range of motion.     Cervical back: Normal range of motion and neck supple.     Right lower leg: No edema.     Left lower leg: No edema.  Lymphadenopathy:     Cervical: No cervical adenopathy.  Skin:    General: Skin is warm and dry.     Capillary Refill: Capillary refill takes less than 2 seconds.     Findings: No rash.  Neurological:     Mental Status: He is alert and oriented to person, place, and time.     Deep Tendon Reflexes: Reflexes are normal and symmetric.  Psychiatric:        Attention and Perception: Attention normal.        Mood and Affect: Mood normal.        Speech: Speech normal.        Behavior: Behavior normal. Behavior is cooperative.        Thought Content: Thought content normal.     Results for orders placed or performed during the hospital encounter of 01/12/19  Rapid Influenza A&B Antigens ([facility] only)   Specimen: Flu Kit Nasopharyngeal Swab; Respiratory  Result Value Ref Range   Influenza A ([facility]) NEGATIVE NEGATIVE   Influenza B ([facility]) NEGATIVE NEGATIVE      Assessment & Plan:   Problem List Items Addressed This Visit      Other   RUQ pain - Primary    Acute x one episode, now improved with mild tenderness.  Will obtain labs today to include CBC, CMP, TSH, GGT.  Order for imaging obtained to further assess gall bladder and area approx 3 finger breadth above umbilicus.  Educated patient on diet changes at this time to avoid further acute events, but if they present or has worsening immediately notify office or go straight to ER.  Consider referral to GI or general surgery after review of labs and imaging.  Return in 2 weeks for follow-up, sooner if worsening episodes.       Relevant Orders   CBC with Differential/Platelet   Comprehensive metabolic panel   TSH   Gamma GT   US Abdomen Limited RUQ (LIVER/GB)       Follow up plan: Return in about 2 weeks (around 12/03/2020) for RUQ PAIN.

## 2020-11-20 LAB — TSH: TSH: 2.57 u[IU]/mL (ref 0.450–4.500)

## 2020-11-20 LAB — CBC WITH DIFFERENTIAL/PLATELET
Basophils Absolute: 0 10*3/uL (ref 0.0–0.2)
Basos: 1 %
EOS (ABSOLUTE): 0 10*3/uL (ref 0.0–0.4)
Eos: 1 %
Hematocrit: 50 % (ref 37.5–51.0)
Hemoglobin: 17.1 g/dL (ref 13.0–17.7)
Immature Grans (Abs): 0 10*3/uL (ref 0.0–0.1)
Immature Granulocytes: 0 %
Lymphocytes Absolute: 1.6 10*3/uL (ref 0.7–3.1)
Lymphs: 31 %
MCH: 30.2 pg (ref 26.6–33.0)
MCHC: 34.2 g/dL (ref 31.5–35.7)
MCV: 88 fL (ref 79–97)
Monocytes Absolute: 0.4 10*3/uL (ref 0.1–0.9)
Monocytes: 8 %
Neutrophils Absolute: 3.1 10*3/uL (ref 1.4–7.0)
Neutrophils: 59 %
Platelets: 272 10*3/uL (ref 150–450)
RBC: 5.67 x10E6/uL (ref 4.14–5.80)
RDW: 12.3 % (ref 11.6–15.4)
WBC: 5.1 10*3/uL (ref 3.4–10.8)

## 2020-11-20 LAB — COMPREHENSIVE METABOLIC PANEL
ALT: 25 IU/L (ref 0–44)
AST: 25 IU/L (ref 0–40)
Albumin/Globulin Ratio: 1.9 (ref 1.2–2.2)
Albumin: 4.6 g/dL (ref 3.8–4.9)
Alkaline Phosphatase: 96 IU/L (ref 44–121)
BUN/Creatinine Ratio: 11 (ref 9–20)
BUN: 14 mg/dL (ref 6–24)
Bilirubin Total: 0.8 mg/dL (ref 0.0–1.2)
CO2: 24 mmol/L (ref 20–29)
Calcium: 9.5 mg/dL (ref 8.7–10.2)
Chloride: 100 mmol/L (ref 96–106)
Creatinine, Ser: 1.23 mg/dL (ref 0.76–1.27)
GFR calc Af Amer: 74 mL/min/{1.73_m2} (ref >59)
GFR calc non Af Amer: 64 mL/min/{1.73_m2} (ref >59)
Globulin, Total: 2.4 g/dL (ref 1.5–4.5)
Glucose: 96 mg/dL (ref 65–99)
Potassium: 4.3 mmol/L (ref 3.5–5.2)
Sodium: 138 mmol/L (ref 134–144)
Total Protein: 7 g/dL (ref 6.0–8.5)

## 2020-11-20 LAB — GAMMA GT: GGT: 13 IU/L (ref 0–65)

## 2020-11-20 NOTE — Progress Notes (Signed)
Contacted via MyChart   CBC returned normal, no infection concerns.:)

## 2020-11-20 NOTE — Progress Notes (Signed)
Contacted via MyChart   Good morning [name].  Your labs have returned.  Overall they are within normal ranges, which is good news.  Liver function and gall bladder testing normal.  We will see what imaging shows.  Any questions?

## 2020-11-22 ENCOUNTER — Other Ambulatory Visit: Payer: Self-pay

## 2020-11-22 ENCOUNTER — Ambulatory Visit
Admission: RE | Admit: 2020-11-22 | Discharge: 2020-11-22 | Disposition: A | Discharge status: Home / Self Care | Payer: BC Managed Care – PPO | Source: Ambulatory Visit | Attending: Nurse Practitioner | Admitting: Nurse Practitioner

## 2020-11-22 DIAGNOSIS — R1011 Right upper quadrant pain: Secondary | ICD-10-CM | POA: Insufficient documentation

## 2020-11-22 NOTE — Progress Notes (Signed)
Contacted via MyChart   Good evening Stanley Ritter, your imaging has returned.  Liver showed a small lesion, could be benign finding as liver function labs were normal.  Usually these are benign vascular masses, but we would pursue MRI to further look at it to further assess if you would like.  The area of your tenderness is a small hernia above the belly button, that is what we palpated.  It is small, but if causing you discomfort we could send you to general surgery for suggestions.  Any questions or thoughts? Keep being awesome!!  Thank you for allowing me to participate in your care. Kindest regards, Mayur Duman

## 2020-11-29 NOTE — Telephone Encounter (Signed)
error 

## 2020-12-04 ENCOUNTER — Other Ambulatory Visit: Payer: Self-pay

## 2020-12-04 ENCOUNTER — Ambulatory Visit (INDEPENDENT_AMBULATORY_CARE_PROVIDER_SITE_OTHER): Payer: BC Managed Care – PPO | Admitting: Nurse Practitioner

## 2020-12-04 ENCOUNTER — Encounter: Payer: Self-pay | Admitting: Nurse Practitioner

## 2020-12-04 VITALS — BP 111/73 | HR 56 | Temp 98.1°F | Ht 68.94 in | Wt 163.8 lb

## 2020-12-04 DIAGNOSIS — R1011 Right upper quadrant pain: Secondary | ICD-10-CM | POA: Diagnosis not present

## 2020-12-04 DIAGNOSIS — Z23 Encounter for immunization: Secondary | ICD-10-CM | POA: Diagnosis not present

## 2020-12-04 DIAGNOSIS — L84 Corns and callosities: Secondary | ICD-10-CM | POA: Insufficient documentation

## 2020-12-04 NOTE — Assessment & Plan Note (Signed)
Acute and improved at this time.  Hernia no palpated today on exam and no further tenderness.  Will watchful wait, discussed with patient.  May return to skating, has upcoming competition.  If any return of pain or bulging of hernia return to office and will get into general surgery.

## 2020-12-04 NOTE — Assessment & Plan Note (Signed)
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  Suspect related to pressure point.  Recommend continue to wear gel when skating.  Will place referral to podiatry to assist with area and offer further recommendations.

## 2020-12-04 NOTE — Patient Instructions (Signed)
Hernia, Adult   A hernia happens when tissue inside your body pushes out through a weak spot in your belly muscles (abdominal wall). This makes a round lump (bulge). The lump may be: In a scar from surgery that was done in your belly (incisional hernia). Near your belly button (umbilical hernia). In your groin (inguinal hernia). Your groin is the area where your leg meets your lower belly (abdomen). This kind of hernia could also be: In your scrotum, if you are male. In folds of skin around your vagina, if you are male. In your upper thigh (femoral hernia). Inside your belly (hiatal hernia). This happens when your stomach slides above the muscle between your belly and your chest (diaphragm). If your hernia is small and it does not cause pain, you may not need treatment. If your hernia is large or it causes pain, you may need surgery. Follow these instructions at home: Activity Avoid stretching or overusing (straining) the muscles near your hernia. Straining can happen when you: Lift something heavy. Poop (have a bowel movement). Do not lift anything that is heavier than 10 lb (4.5 kg), or the limit that you are told, until your doctor says that it is safe. Use the strength of your legs when you lift something heavy. Do not use only your back muscles to lift. General instructions Do these things if told by your doctor so you do not have trouble pooping (constipation): Drink enough fluid to keep your pee (urine) pale yellow. Eat foods that are high in fiber. These include fresh fruits and vegetables, whole grains, and beans. Limit foods that are high in fat and processed sugars. These include foods that are fried or sweet. Take medicine for trouble pooping. When you cough, try to cough gently. You may try to push your hernia in by very gently pressing on it when you are lying down. Do not try to force the bulge back in if it will not push in easily. If you are overweight, work with your  doctor to lose weight safely. Do not use any products that have nicotine or tobacco in them. These include cigarettes and e-cigarettes. If you need help quitting, ask your doctor. If you will be having surgery (hernia repair), watch your hernia for changes in shape, size, or color. Tell your doctor if you see any changes. Take over-the-counter and prescription medicines only as told by your doctor. Keep all follow-up visits as told by your doctor. Contact a doctor if: You get new pain, swelling, or redness near your hernia. You poop fewer times in a week than normal. You have trouble pooping. You have poop (stool) that is more dry than normal. You have poop that is harder or larger than normal. Get help right away if: You have a fever. You have belly pain that gets worse. You feel sick to your stomach (nauseous). You throw up (vomit). Your hernia cannot be pushed in by very gently pressing on it when you are lying down. Do not try to force the bulge back in if it will not push in easily. Your hernia: Changes in shape or size. Changes color. Feels hard or it hurts when you touch it. These symptoms may represent a serious problem that is an emergency. Do not wait to see if the symptoms will go away. Get medical help right away. Call your local emergency services (911 in the [place] Summary A hernia happens when tissue inside your body pushes out through a weak spot in the   belly muscles. This creates a bulge. If your hernia is small and it does not hurt, you may not need treatment. If your hernia is large or it hurts, you may need surgery. If you will be having surgery, watch your hernia for changes in shape, size, or color. Tell your doctor about any changes. This information is not intended to replace advice given to you by your health care provider. Make sure you discuss any questions you have with your health care provider. Document Revised: 02/09/2019 Document Reviewed:  07/21/2017 Elsevier Patient Education  2021 Elsevier Inc.  

## 2020-12-04 NOTE — Progress Notes (Signed)
BP 111/73   Pulse (!) 56   Temp 98.1 F (36.7 C) (Oral)   Ht 5' 8.94" (1.751 m)   Wt 163 lb 12.8 oz (74.3 kg)   SpO2 100%   BMI 24.23 kg/m    Subjective:    Patient ID: Stanley Ritter, male    DOB: 1961-01-14, 60 y.o.   MRN: [id number]  HPI: Stanley Ritter is a 60 y.o. male  Chief Complaint  Patient presents with  . F/U RUQ pain    Patient states he is feeling better. Patient states he has been watching what he has been doing. Wants to make sure he will be able to continue his figure skating.   ABDOMINAL PAIN  Follow-up for abdominal pain RUQ, initially seen 11/19/20.  Labs returned unremarkable.  Ultrasound imaging did note a 1.3 cm lesion (suspected hemangioma) to right hepatic lobe and a small supraumbilical hernia containing adipose tissue.  He reports discomfort has improved, only notices discomfort at work if lifts anything heavy.  He is skating, but has not returned to full regimen as of yet.    Does have history of back strain right side 3 weeks ago, but this improved.  Is an Teacher, English as a foreign language.  Non smoker and minimal alcohol use at home reported. Duration: improved Onset: sudden Severity: improved Location: RUQ Episode duration:  Alleviating factors: sitting up Aggravating factors: laying down Status: stable Treatments attempted: none Fever: no Nausea: no Vomiting: no Weight loss: no Decreased appetite: no Diarrhea: no Constipation: no Blood in stool: no Heartburn: no Jaundice: no Rash: no Dysuria/urinary frequency: no Hematuria: no History of sexually transmitted disease: no Recurrent NSAID use: no   SKIN CALLUS To right 5th toe, callus.  From skating -- he has gotten it smaller, originally started from pressure point on skates.  Now it has gotten to aspect where it is tender.  He is wearing a gel pad on top of toe to keep pressure off. Duration: months Location:  Right 5th toe Painful: occasional Itching: no Onset: gradual Context: bigger Associated  signs and symptoms: some discomfort History of skin cancer: no History of precancerous skin lesions: no Family history of skin cancer: no  Relevant past medical, surgical, family and social history reviewed and updated as indicated. Interim medical history since our last visit reviewed. Allergies and medications reviewed and updated.  Review of Systems  Constitutional: Negative for activity change, diaphoresis, fatigue and fever.  Respiratory: Negative for cough, chest tightness, shortness of breath and wheezing.   Cardiovascular: Negative for chest pain, palpitations and leg swelling.  Gastrointestinal: Negative for abdominal distention, abdominal pain (improved), blood in stool, constipation, diarrhea, nausea and vomiting.  Endocrine: Negative for cold intolerance and heat intolerance.  Neurological: Negative.   Psychiatric/Behavioral: Negative.     Per HPI unless specifically indicated above     Objective:    BP 111/73   Pulse (!) 56   Temp 98.1 F (36.7 C) (Oral)   Ht 5' 8.94" (1.751 m)   Wt 163 lb 12.8 oz (74.3 kg)   SpO2 100%   BMI 24.23 kg/m   Wt Readings from Last 3 Encounters:  12/04/20 163 lb 12.8 oz (74.3 kg)  11/19/20 162 lb 6.4 oz (73.7 kg)  01/12/19 163 lb (73.9 kg)    Physical Exam Vitals and nursing note reviewed.  Constitutional:      General: He is awake. He is not in acute distress.    Appearance: He is well-developed and well-groomed. He is  not ill-appearing or toxic-appearing.  HENT:     Head: Normocephalic and atraumatic.     Right Ear: Hearing normal. No drainage.     Left Ear: Hearing normal. No drainage.  Eyes:     General: Lids are normal.        Right eye: No discharge.        Left eye: No discharge.     Conjunctiva/sclera: Conjunctivae normal.     Pupils: Pupils are equal, round, and reactive to light.  Neck:     Thyroid: No thyromegaly.     Vascular: No carotid bruit.     Trachea: Trachea normal.  Cardiovascular:     Rate and  Rhythm: Normal rate and regular rhythm.     Pulses:          Dorsalis pedis pulses are 2+ on the right side and 2+ on the left side.       Posterior tibial pulses are 2+ on the right side and 2+ on the left side.     Heart sounds: Normal heart sounds, S1 normal and S2 normal. No murmur heard. No gallop.   Pulmonary:     Effort: Pulmonary effort is normal. No accessory muscle usage or respiratory distress.     Breath sounds: Normal breath sounds.  Abdominal:     General: Bowel sounds are normal. There is no distension or abdominal bruit.     Palpations: Abdomen is soft. There is no hepatomegaly or splenomegaly.     Tenderness: There is no abdominal tenderness. There is no right CVA tenderness, left CVA tenderness, guarding or rebound. Negative signs include Murphy's sign.     Comments: Negative Murphy.  No tenderness on exam today and unable to palpate hernia area above umbilicus that was noted on last exam.  Musculoskeletal:        General: Normal range of motion.     Cervical back: Normal range of motion and neck supple.     Right lower leg: No edema.     Left lower leg: No edema.  Feet:     Right foot:     Protective Sensation: 10 sites tested. 10 sites sensed.     Skin integrity: Callus present.     Toenail Condition: Right toenails are normal.     Left foot:     Protective Sensation: 10 sites tested. 10 sites sensed.     Skin integrity: Skin integrity normal.     Toenail Condition: Left toenails are normal.     Comments: Thick, raised, tender callus to outer aspect right 5th toe. Lymphadenopathy:     Cervical: No cervical adenopathy.  Skin:    General: Skin is warm and dry.     Capillary Refill: Capillary refill takes less than 2 seconds.     Findings: No rash.  Neurological:     Mental Status: He is alert and oriented to person, place, and time.     Deep Tendon Reflexes: Reflexes are normal and symmetric.  Psychiatric:        Attention and Perception: Attention normal.         Mood and Affect: Mood normal.        Speech: Speech normal.        Behavior: Behavior normal. Behavior is cooperative.        Thought Content: Thought content normal.     Results for orders placed or performed in visit on 11/19/20  CBC with Differential/Platelet  Result Value Ref Range  WBC 5.1 3.4 - 10.8 x10E3/uL   RBC 5.67 4.14 - 5.80 x10E6/uL   Hemoglobin 17.1 13.0 - 17.7 g/dL   Hematocrit 57.0 17.7 - 51.0 %   MCV 88 79 - 97 fL   MCH 30.2 26.6 - 33.0 pg   MCHC 34.2 31.5 - 35.7 g/dL   RDW 93.9 03.0 - 09.2 %   Platelets 272 150 - 450 x10E3/uL   Neutrophils 59 Not Estab. %   Lymphs 31 Not Estab. %   Monocytes 8 Not Estab. %   Eos 1 Not Estab. %   Basos 1 Not Estab. %   Neutrophils Absolute 3.1 1.4 - 7.0 x10E3/uL   Lymphocytes Absolute 1.6 0.7 - 3.1 x10E3/uL   Monocytes Absolute 0.4 0.1 - 0.9 x10E3/uL   EOS (ABSOLUTE) 0.0 0.0 - 0.4 x10E3/uL   Basophils Absolute 0.0 0.0 - 0.2 x10E3/uL   Immature Granulocytes 0 Not Estab. %   Immature Grans (Abs) 0.0 0.0 - 0.1 x10E3/uL  Comprehensive metabolic panel  Result Value Ref Range   Glucose 96 65 - 99 mg/dL   BUN 14 6 - 24 mg/dL   Creatinine, Ser 3.30 0.76 - 1.27 mg/dL   GFR calc non Af Amer 64 >59 mL/min/1.73   GFR calc Af Amer 74 >59 mL/min/1.73   BUN/Creatinine Ratio 11 9 - 20   Sodium 138 134 - 144 mmol/L   Potassium 4.3 3.5 - 5.2 mmol/L   Chloride 100 96 - 106 mmol/L   CO2 24 20 - 29 mmol/L   Calcium 9.5 8.7 - 10.2 mg/dL   Total Protein 7.0 6.0 - 8.5 g/dL   Albumin 4.6 3.8 - 4.9 g/dL   Globulin, Total 2.4 1.5 - 4.5 g/dL   Albumin/Globulin Ratio 1.9 1.2 - 2.2   Bilirubin Total 0.8 0.0 - 1.2 mg/dL   Alkaline Phosphatase 96 44 - 121 IU/L   AST 25 0 - 40 IU/L   ALT 25 0 - 44 IU/L  TSH  Result Value Ref Range   TSH 2.570 0.450 - 4.500 uIU/mL  Gamma GT  Result Value Ref Range   GGT 13 0 - 65 IU/L      Assessment & Plan:   Problem List Items Addressed This Visit      Musculoskeletal and Integument   Callus of  foot    Ongoing with worsening tenderness to area in competition figure skater.  Suspect related to pressure point.  Recommend continue to wear gel when skating.  Will place referral to podiatry to assist with area and offer further recommendations.        Relevant Orders   Ambulatory referral to Podiatry     Other   RUQ pain - Primary    Acute and improved at this time.  Hernia no palpated today on exam and no further tenderness.  Will watchful wait, discussed with patient.  May return to skating, has upcoming competition.  If any return of pain or bulging of hernia return to office and will get into general surgery.       Other Visit Diagnoses    Need for influenza vaccination       Flu vaccine today.   Relevant Orders   Flu Vaccine QUAD 6+ mos PF IM (Fluarix Quad PF) (Completed)       Follow up plan: Return in about 6 months (around 06/03/2021) for Annual physical.

## 2020-12-17 ENCOUNTER — Other Ambulatory Visit: Payer: Self-pay

## 2020-12-17 ENCOUNTER — Ambulatory Visit (INDEPENDENT_AMBULATORY_CARE_PROVIDER_SITE_OTHER): Payer: BC Managed Care – PPO | Admitting: Podiatry

## 2020-12-17 DIAGNOSIS — L989 Disorder of the skin and subcutaneous tissue, unspecified: Secondary | ICD-10-CM

## 2020-12-17 DIAGNOSIS — M79674 Pain in right toe(s): Secondary | ICD-10-CM

## 2020-12-17 NOTE — Progress Notes (Signed)
   Subjective: 60 y.o. male presenting to the office today as a new patient for evaluation of pain and tenderness to the right fifth toe secondary to callus formations.  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  He is concerned because he has a regional competition coming up and he is hoping to get some relief for the calluses that have developed to the fifth toe.  He presents for further treatment evaluation   Past Medical History:  Diagnosis Date  . Allergy   . Dupuytren's contracture   . Migraines      Objective:  Physical Exam General: Alert and oriented x3 in no acute distress  Dermatology: Hyperkeratotic lesion(s) present on the lateral and medial interdigital area of the right fifth toe. Pain on palpation with a central nucleated core noted. Skin is warm, dry and supple bilateral lower extremities. Negative for open lesions or macerations.  Vascular: Palpable pedal pulses bilaterally. No edema or erythema noted. Capillary refill within normal limits.  Neurological: Epicritic and protective threshold grossly intact bilaterally.   Musculoskeletal Exam: Pain on palpation at the keratotic lesion(s) noted. Range of motion within normal limits bilateral. Muscle strength 5/5 in all groups bilateral.  Assessment: 1.  Preulcerative callus/corn right fifth toe x2   Plan of Care:  1. Patient evaluated 2. Excisional debridement of keratoic lesion(s) using a chisel blade was performed without incident.  3. Dressed area with light dressing. 4.  I did offer to the patient that at some point he would maybe like to consider an arthroplasty of the fifth digit of the right foot to permanently alleviate the symptomatic lesions that developed.  The patient has been dealing with these lesions for a few years now with minimal relief despite shoe gear modifications and padding. 5.  Return  to clinic in 6 weeks.  This will be just before his nationals competition and he may need a conservative debridement at this time  *Works at [employer] all day.  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  Felecia Shelling, DPM [facility]  Dr. Felecia Shelling, DPM    [address] [place], [place] [address]                Office [phone]  Fax [phone]

## 2021-02-04 ENCOUNTER — Ambulatory Visit: Payer: BC Managed Care – PPO | Admitting: Podiatry

## 2021-04-15 ENCOUNTER — Ambulatory Visit: Payer: Self-pay

## 2021-04-15 NOTE — Telephone Encounter (Signed)
Pt tested positive for covid on a rapid test this am. Pt began having symptoms at 0400 this am. Pt having fatigue, fever up to 102, productive cough with clear phlegm, body aches and sore throat.  Cough is frequent. During call pt began to sound like he was grunting with expiration. Pt C/o chest feels tight and that he is SOB at rest. It sounded like he was wheezing. Advised pt to go to UC. Attempted to call [facility] in [place] twice but no one answered. Pt stated he will go to a [facility] ER in [place]. Advised pt if worsens while driving to pull onto the side of the road and call 911. Care advice given and pt verbalized understanding.  Care advice also sent to pt via MyChart.

## 2021-04-15 NOTE — Telephone Encounter (Signed)
Fever 102 now 100.5 body sache fatigue  Sx started this am 0400.    Reason for Disposition  [1] COVID-19 diagnosed by positive lab test (e.g., PCR, rapid self-test kit) AND [2] mild symptoms (e.g., cough, fever, others) AND [8] no complications or SOB  MODERATE difficulty breathing (e.g., speaks in phrases, SOB even at rest, pulse 100-120)  Answer Assessment - Initial Assessment Questions 1. COVID-19 DIAGNOSIS: "Who made your COVID-19 diagnosis?" "Was it confirmed by a positive lab test or self-test?" If not diagnosed by a doctor (or NP/PA), ask "Are there lots of cases (community spread) where you live?" Note: See public health department website, if unsure.     Rapid test positive 2. COVID-19 EXPOSURE: "Was there any known exposure to COVID before the symptoms began?" CDC Definition of close contact: within 6 feet (2 meters) for a total of 15 minutes or more over a 24-hour period.      no 3. ONSET: "When did the COVID-19 symptoms start?"      This am 0400 4. WORST SYMPTOM: "What is your worst symptom?" (e.g., cough, fever, shortness of breath, muscle aches)     Body aches 5. COUGH: "Do you have a cough?" If Yes, ask: "How bad is the cough?"       Yes productive clear phelgm 6. FEVER: "Do you have a fever?" If Yes, ask: "What is your temperature, how was it measured, and when did it start?"     Yes 100.5 to 102 7. RESPIRATORY STATUS: "Describe your breathing?" (e.g., shortness of breath, wheezing, unable to speak)      No SOB, wheezing breathing  8. BETTER-SAME-WORSE: "Are you getting better, staying the same or getting worse compared to yesterday?"  If getting worse, ask, "In what way?"     worse 9. HIGH RISK DISEASE: "Do you have any chronic medical problems?" (e.g., asthma, heart or lung disease, weak immune system, obesity, etc.)     no 10. VACCINE: "Have you had the COVID-19 vaccine?" If Yes, ask: "Which one, how many shots, when did you get it?"       Yes 2 shots  11. BOOSTER:  "Have you received your COVID-19 booster?" If Yes, ask: "Which one and when did you get it?"       no  13. OTHER SYMPTOMS: "Do you have any other symptoms?"  (e.g., chills, fatigue, headache, loss of smell or taste, muscle pain, sore throat)       Fatigue, muscle pain, fever, sore throat- 14. O2 SATURATION MONITOR:  "Do you use an oxygen saturation monitor (pulse oximeter) at home?" If Yes, ask "What is your reading (oxygen level) today?" "What is your usual oxygen saturation reading?" (e.g., 95%)     no  Protocols used: Coronavirus (COVID-19) Diagnosed or Suspected-A-AH

## 2021-04-16 NOTE — Telephone Encounter (Signed)
FYI pt was advised to go to ED by triage

## 2021-06-13 ENCOUNTER — Other Ambulatory Visit: Payer: Self-pay

## 2021-06-13 ENCOUNTER — Telehealth: Payer: Self-pay

## 2021-06-13 ENCOUNTER — Ambulatory Visit
Admission: RE | Admit: 2021-06-13 | Discharge: 2021-06-13 | Disposition: A | Discharge status: Home / Self Care | Payer: BC Managed Care – PPO | Attending: Internal Medicine | Admitting: Internal Medicine

## 2021-06-13 ENCOUNTER — Ambulatory Visit (INDEPENDENT_AMBULATORY_CARE_PROVIDER_SITE_OTHER): Payer: BC Managed Care – PPO | Admitting: Internal Medicine

## 2021-06-13 ENCOUNTER — Ambulatory Visit
Admission: RE | Admit: 2021-06-13 | Discharge: 2021-06-13 | Disposition: A | Discharge status: Home / Self Care | Payer: BC Managed Care – PPO | Source: Ambulatory Visit | Attending: Internal Medicine | Admitting: Internal Medicine

## 2021-06-13 ENCOUNTER — Encounter: Payer: Self-pay | Admitting: Internal Medicine

## 2021-06-13 VITALS — BP 122/84 | HR 57 | Temp 98.0°F | Ht 68.66 in | Wt 164.4 lb

## 2021-06-13 DIAGNOSIS — Z1211 Encounter for screening for malignant neoplasm of colon: Secondary | ICD-10-CM | POA: Diagnosis not present

## 2021-06-13 DIAGNOSIS — M25559 Pain in unspecified hip: Secondary | ICD-10-CM | POA: Diagnosis present

## 2021-06-13 DIAGNOSIS — Z0000 Encounter for general adult medical examination without abnormal findings: Secondary | ICD-10-CM | POA: Diagnosis not present

## 2021-06-13 LAB — URINALYSIS, ROUTINE W REFLEX MICROSCOPIC
Bilirubin, UA: NEGATIVE
Glucose, UA: NEGATIVE
Ketones, UA: NEGATIVE
Leukocytes,UA: NEGATIVE
Nitrite, UA: NEGATIVE
Protein,UA: NEGATIVE
RBC, UA: NEGATIVE
Specific Gravity, UA: 1.015 (ref 1.005–1.030)
Urobilinogen, Ur: 0.2 mg/dL (ref 0.2–1.0)
pH, UA: 6.5 (ref 5.0–7.5)

## 2021-06-13 NOTE — Progress Notes (Signed)
BP 122/84   Pulse (!) 57   Temp 98 F (36.7 C) (Oral)   Ht 5' 8.66" (1.744 m)   Wt 164 lb 6.4 oz (74.6 kg)   SpO2 98%   BMI 24.52 kg/m    Subjective:    Patient ID: Stanley Ritter, male    DOB: Apr 21, 1961, 60 y.o.   MRN: [id number]  Chief Complaint  Patient presents with   Annual Exam    HPI: Stanley Ritter is a 60 y.o. male  Pt is here for a physical had COVID a month and a half ago. Was fully vaccinated, was in the hospital sec to hemoptysis had to wait x 7 1/2 hrs / SOB as well.  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 Windell Moulding   In @ [facility]  Hip Pain  Incident onset: is an ice skater and compettion level. hurts when he exerts at skating.   Chief Complaint  Patient presents with   Annual Exam    Relevant past medical, surgical, family and social history reviewed and updated as indicated. Interim medical history since our last visit reviewed. Allergies and medications reviewed and updated.  Review of Systems  Per HPI unless specifically indicated above     Objective:    BP 122/84   Pulse (!) 57   Temp 98 F (36.7 C) (Oral)   Ht 5' 8.66" (1.744 m)   Wt 164 lb 6.4 oz (74.6 kg)   SpO2 98%   BMI 24.52 kg/m   Wt Readings from Last 3 Encounters:  06/13/21 164 lb 6.4 oz (74.6 kg)  12/04/20 163 lb 12.8 oz (74.3 kg)  11/19/20 162 lb 6.4 oz (73.7 kg)    Physical Exam Vitals and nursing note reviewed.  Constitutional:      General: He is not in acute distress.    Appearance: Normal appearance. He is not ill-appearing or diaphoretic.  HENT:     Head: Normocephalic and atraumatic.     Right Ear: Tympanic membrane and external ear normal. There is no impacted cerumen.     Left Ear: External ear normal.     Nose: No congestion or rhinorrhea.     Mouth/Throat:     Pharynx: No oropharyngeal exudate or posterior oropharyngeal erythema.  Eyes:     Conjunctiva/sclera: Conjunctivae normal.      Pupils: Pupils are equal, round, and reactive to light.  Cardiovascular:     Rate and Rhythm: Normal rate and regular rhythm.     Heart sounds: No murmur heard.   No friction rub. No gallop.  Pulmonary:     Effort: No respiratory distress.     Breath sounds: No stridor. No wheezing or rhonchi.  Chest:     Chest wall: No tenderness.  Abdominal:     General: Abdomen is flat. Bowel sounds are normal.     Palpations: Abdomen is soft. There is no mass.     Tenderness: There is no abdominal tenderness.  Musculoskeletal:     Cervical back: Normal range of motion and neck supple. No rigidity or tenderness.     Left lower leg: No edema.  Skin:    General: Skin is warm and dry.  Neurological:     Mental Status: He is alert.    Results for orders placed or performed in visit on 11/19/20  CBC with Differential/Platelet  Result Value Ref Range   WBC 5.1 3.4 - 10.8  x10E3/uL   RBC 5.67 4.14 - 5.80 x10E6/uL   Hemoglobin 17.1 13.0 - 17.7 g/dL   Hematocrit 70.9 62.8 - 51.0 %   MCV 88 79 - 97 fL   MCH 30.2 26.6 - 33.0 pg   MCHC 34.2 31.5 - 35.7 g/dL   RDW 36.6 29.4 - 76.5 %   Platelets 272 150 - 450 x10E3/uL   Neutrophils 59 Not Estab. %   Lymphs 31 Not Estab. %   Monocytes 8 Not Estab. %   Eos 1 Not Estab. %   Basos 1 Not Estab. %   Neutrophils Absolute 3.1 1.4 - 7.0 x10E3/uL   Lymphocytes Absolute 1.6 0.7 - 3.1 x10E3/uL   Monocytes Absolute 0.4 0.1 - 0.9 x10E3/uL   EOS (ABSOLUTE) 0.0 0.0 - 0.4 x10E3/uL   Basophils Absolute 0.0 0.0 - 0.2 x10E3/uL   Immature Granulocytes 0 Not Estab. %   Immature Grans (Abs) 0.0 0.0 - 0.1 x10E3/uL  Comprehensive metabolic panel  Result Value Ref Range   Glucose 96 65 - 99 mg/dL   BUN 14 6 - 24 mg/dL   Creatinine, Ser 4.65 0.76 - 1.27 mg/dL   GFR calc non Af Amer 64 >59 mL/min/1.73   GFR calc Af Amer 74 >59 mL/min/1.73   BUN/Creatinine Ratio 11 9 - 20   Sodium 138 134 - 144 mmol/L   Potassium 4.3 3.5 - 5.2 mmol/L   Chloride 100 96 - 106 mmol/L    CO2 24 20 - 29 mmol/L   Calcium 9.5 8.7 - 10.2 mg/dL   Total Protein 7.0 6.0 - 8.5 g/dL   Albumin 4.6 3.8 - 4.9 g/dL   Globulin, Total 2.4 1.5 - 4.5 g/dL   Albumin/Globulin Ratio 1.9 1.2 - 2.2   Bilirubin Total 0.8 0.0 - 1.2 mg/dL   Alkaline Phosphatase 96 44 - 121 IU/L   AST 25 0 - 40 IU/L   ALT 25 0 - 44 IU/L  TSH  Result Value Ref Range   TSH 2.570 0.450 - 4.500 uIU/mL  Gamma GT  Result Value Ref Range   GGT 13 0 - 65 IU/L        Current Outpatient Medications:    Coenzyme Q10 (CO Q 10 PO), Take 1 tablet by mouth daily., Disp: , Rfl:    Multiple Vitamin (MULTI-VITAMINS) TABS, Take 1 tablet by mouth daily., Disp: , Rfl:    Omega-3 Fatty Acids (FISH OIL PO), Take by mouth daily., Disp: , Rfl:    triamcinolone (NASACORT) 55 MCG/ACT AERO nasal inhaler, Place 2 sprays into the nose daily., Disp: , Rfl:     Assessment & Plan:  PHYSICAL :  Physical Wnl will check CMP, FLP, CBC,TSH, PSA.  Ho migraines stable takes otc migraine meds. Is  a figurine   Hip pain bil ? Sec to ice skating - has been doing this for over 10 yrs.   Problem List Items Addressed This Visit   None    Orders Placed This Encounter  Procedures   DG Hip Unilat W OR W/O Pelvis 2-3 Views Right   DG HIP UNILAT W OR W/O PELVIS 2-3 VIEWS LEFT   PSA   TSH   PSA   Lipid panel   CBC with Differential/Platelet   Comprehensive metabolic panel   Urinalysis, Routine w reflex microscopic   Ambulatory referral to Gastroenterology      No orders of the defined types were placed in this encounter.    Follow up plan: No follow-ups on file.  Health Maintenance :  PSA : ordereed Cscope :scheudled

## 2021-06-13 NOTE — Telephone Encounter (Signed)
Called to do screening and get him scheduled no answer left voicemail for a call back.

## 2021-06-14 LAB — COMPREHENSIVE METABOLIC PANEL
ALT: 25 IU/L (ref 0–44)
AST: 31 IU/L (ref 0–40)
Albumin/Globulin Ratio: 2 (ref 1.2–2.2)
Albumin: 4.5 g/dL (ref 3.8–4.9)
Alkaline Phosphatase: 104 IU/L (ref 44–121)
BUN/Creatinine Ratio: 15 (ref 9–20)
BUN: 17 mg/dL (ref 6–24)
Bilirubin Total: 0.7 mg/dL (ref 0.0–1.2)
CO2: 26 mmol/L (ref 20–29)
Calcium: 9.7 mg/dL (ref 8.7–10.2)
Chloride: 101 mmol/L (ref 96–106)
Creatinine, Ser: 1.17 mg/dL (ref 0.76–1.27)
Globulin, Total: 2.2 g/dL (ref 1.5–4.5)
Glucose: 97 mg/dL (ref 65–99)
Potassium: 4.5 mmol/L (ref 3.5–5.2)
Sodium: 141 mmol/L (ref 134–144)
Total Protein: 6.7 g/dL (ref 6.0–8.5)
eGFR: 72 mL/min/{1.73_m2} (ref >59)

## 2021-06-14 LAB — CBC WITH DIFFERENTIAL/PLATELET
Basophils Absolute: 0 10*3/uL (ref 0.0–0.2)
Basos: 1 %
EOS (ABSOLUTE): 0.1 10*3/uL (ref 0.0–0.4)
Eos: 2 %
Hematocrit: 49.8 % (ref 37.5–51.0)
Hemoglobin: 16.5 g/dL (ref 13.0–17.7)
Immature Grans (Abs): 0 10*3/uL (ref 0.0–0.1)
Immature Granulocytes: 0 %
Lymphocytes Absolute: 2 10*3/uL (ref 0.7–3.1)
Lymphs: 40 %
MCH: 29.8 pg (ref 26.6–33.0)
MCHC: 33.1 g/dL (ref 31.5–35.7)
MCV: 90 fL (ref 79–97)
Monocytes Absolute: 0.5 10*3/uL (ref 0.1–0.9)
Monocytes: 9 %
Neutrophils Absolute: 2.4 10*3/uL (ref 1.4–7.0)
Neutrophils: 48 %
Platelets: 236 10*3/uL (ref 150–450)
RBC: 5.54 x10E6/uL (ref 4.14–5.80)
RDW: 13.1 % (ref 11.6–15.4)
WBC: 5 10*3/uL (ref 3.4–10.8)

## 2021-06-14 LAB — LIPID PANEL
Chol/HDL Ratio: 3.5 ratio (ref 0.0–5.0)
Cholesterol, Total: 197 mg/dL (ref 100–199)
HDL: 57 mg/dL (ref >39)
LDL Chol Calc (NIH): 129 mg/dL — ABNORMAL HIGH (ref 0–99)
Triglycerides: 57 mg/dL (ref 0–149)
VLDL Cholesterol Cal: 11 mg/dL (ref 5–40)

## 2021-06-14 LAB — TSH: TSH: 2.73 u[IU]/mL (ref 0.450–4.500)

## 2021-06-14 LAB — PSA: Prostate Specific Ag, Serum: 0.8 ng/mL (ref 0.0–4.0)

## 2021-06-16 ENCOUNTER — Telehealth: Payer: Self-pay

## 2021-06-16 NOTE — Progress Notes (Signed)
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.  We have met before.  Dr. Neomia Dear is out of office a few weeks and I am going through her labs.  Your hip imaging showed normal right hip with no changes on review images and report.  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  How is your pain? Would recommend physical therapy to start if discomfort present, especially with your skating.  Any questions? Keep being awesome!!  Thank you for allowing me to participate in your care.  I appreciate you. Kindest regards, Karrie Fluellen

## 2021-06-16 NOTE — Telephone Encounter (Signed)
CALLED NO ANSWER TRYING TO SCHEDULE PROCEDURE LEFT VOICEMAIL FOR A CALL BACK

## 2021-06-24 ENCOUNTER — Telehealth: Payer: Self-pay

## 2021-06-24 NOTE — Telephone Encounter (Signed)
CALLED PATIENT NO ANSWER NO WAY TO LEAVE VOICEMAIL

## 2021-07-25 ENCOUNTER — Ambulatory Visit: Payer: BC Managed Care – PPO | Admitting: Internal Medicine

## 2021-08-06 DIAGNOSIS — G8929 Other chronic pain: Secondary | ICD-10-CM | POA: Diagnosis not present

## 2021-08-06 DIAGNOSIS — R202 Paresthesia of skin: Secondary | ICD-10-CM | POA: Diagnosis not present

## 2021-08-06 DIAGNOSIS — G5601 Carpal tunnel syndrome, right upper limb: Secondary | ICD-10-CM | POA: Diagnosis not present

## 2021-08-06 DIAGNOSIS — M25521 Pain in right elbow: Secondary | ICD-10-CM | POA: Diagnosis not present

## 2021-10-03 DIAGNOSIS — G8918 Other acute postprocedural pain: Secondary | ICD-10-CM | POA: Diagnosis not present

## 2021-10-03 DIAGNOSIS — M720 Palmar fascial fibromatosis [Dupuytren]: Secondary | ICD-10-CM | POA: Diagnosis not present

## 2021-10-03 HISTORY — PX: HAND SURGERY: SHX662

## 2021-10-08 DIAGNOSIS — M25641 Stiffness of right hand, not elsewhere classified: Secondary | ICD-10-CM | POA: Diagnosis not present

## 2021-10-08 DIAGNOSIS — Z4789 Encounter for other orthopedic aftercare: Secondary | ICD-10-CM | POA: Diagnosis not present

## 2021-10-13 DIAGNOSIS — M25641 Stiffness of right hand, not elsewhere classified: Secondary | ICD-10-CM | POA: Diagnosis not present

## 2021-10-13 DIAGNOSIS — Z4789 Encounter for other orthopedic aftercare: Secondary | ICD-10-CM | POA: Diagnosis not present

## 2021-10-20 DIAGNOSIS — M25641 Stiffness of right hand, not elsewhere classified: Secondary | ICD-10-CM | POA: Diagnosis not present

## 2021-10-20 DIAGNOSIS — Z4789 Encounter for other orthopedic aftercare: Secondary | ICD-10-CM | POA: Diagnosis not present

## 2021-11-05 DIAGNOSIS — M79621 Pain in right upper arm: Secondary | ICD-10-CM | POA: Diagnosis not present

## 2021-11-05 DIAGNOSIS — M25641 Stiffness of right hand, not elsewhere classified: Secondary | ICD-10-CM | POA: Diagnosis not present

## 2021-11-05 DIAGNOSIS — Z4789 Encounter for other orthopedic aftercare: Secondary | ICD-10-CM | POA: Diagnosis not present

## 2021-11-18 DIAGNOSIS — Z4789 Encounter for other orthopedic aftercare: Secondary | ICD-10-CM | POA: Diagnosis not present

## 2021-11-18 DIAGNOSIS — M25641 Stiffness of right hand, not elsewhere classified: Secondary | ICD-10-CM | POA: Diagnosis not present

## 2021-11-27 DIAGNOSIS — Z4789 Encounter for other orthopedic aftercare: Secondary | ICD-10-CM | POA: Diagnosis not present

## 2021-11-27 DIAGNOSIS — M79621 Pain in right upper arm: Secondary | ICD-10-CM | POA: Diagnosis not present

## 2021-11-27 DIAGNOSIS — M25641 Stiffness of right hand, not elsewhere classified: Secondary | ICD-10-CM | POA: Diagnosis not present

## 2021-12-04 DIAGNOSIS — M79641 Pain in right hand: Secondary | ICD-10-CM | POA: Diagnosis not present

## 2021-12-04 DIAGNOSIS — M25641 Stiffness of right hand, not elsewhere classified: Secondary | ICD-10-CM | POA: Diagnosis not present

## 2021-12-04 DIAGNOSIS — Z4789 Encounter for other orthopedic aftercare: Secondary | ICD-10-CM | POA: Diagnosis not present

## 2021-12-11 DIAGNOSIS — M25641 Stiffness of right hand, not elsewhere classified: Secondary | ICD-10-CM | POA: Diagnosis not present

## 2021-12-11 DIAGNOSIS — M79641 Pain in right hand: Secondary | ICD-10-CM | POA: Diagnosis not present

## 2021-12-11 DIAGNOSIS — Z4789 Encounter for other orthopedic aftercare: Secondary | ICD-10-CM | POA: Diagnosis not present

## 2022-01-01 DIAGNOSIS — M25641 Stiffness of right hand, not elsewhere classified: Secondary | ICD-10-CM | POA: Diagnosis not present

## 2022-01-01 DIAGNOSIS — Z4789 Encounter for other orthopedic aftercare: Secondary | ICD-10-CM | POA: Diagnosis not present

## 2022-07-31 ENCOUNTER — Ambulatory Visit: Payer: BC Managed Care – PPO | Admitting: Family Medicine

## 2022-07-31 ENCOUNTER — Encounter: Payer: Self-pay | Admitting: Family Medicine

## 2022-07-31 VITALS — BP 123/82 | HR 75 | Temp 97.9°F | Wt 176.5 lb

## 2022-07-31 DIAGNOSIS — M25571 Pain in right ankle and joints of right foot: Secondary | ICD-10-CM | POA: Diagnosis not present

## 2022-07-31 DIAGNOSIS — Z23 Encounter for immunization: Secondary | ICD-10-CM | POA: Diagnosis not present

## 2022-07-31 NOTE — Progress Notes (Signed)
BP 123/82 (BP Location: Left Arm, Cuff Size: Normal)   Pulse 75   Temp 97.9 F (36.6 C) (Oral)   Wt 176 lb 8 oz (80.1 kg)   SpO2 98%   BMI 26.32 kg/m    Subjective:    Patient ID: Stanley Ritter, male    DOB: [birth year], 61 y.o.   MRN: [id number]  HPI: Stanley Ritter is a 61 y.o. male  Chief Complaint  Patient presents with   Fall   Foot Pain    Patient says about 2 weeks ago, patient says he came down wrong on his R foot during his routine. Patient says he has been wearing an ankle brace and using cold compress. Patient says when he returned to the rink today and applied pressure to the area he noticed that he needed to have it looked at. Patient says he has an upcoming competition on the 17th.    FOOT PAIN Duration: 2-3 weeks Involved foot: R foot Mechanism of injury: landed funny while figure skating Location: Lateral ankle and top of the foot Onset: sudden  Severity: moderate  Quality:  sore and achey Frequency:  constant Radiation: no Aggravating factors: weight bearing, walking, and running  Alleviating factors: ice, APAP, NSAIDs, and brace  Status: better Treatments attempted: rest, ice, heat, APAP, ibuprofen, and aleve  Relief with NSAIDs?:  moderate Weakness with weight bearing or walking: yes Morning stiffness: no Swelling: no Redness: no Bruising: no Paresthesias / decreased sensation: no  Fevers:no   Relevant past medical, surgical, family and social history reviewed and updated as indicated. Interim medical history since our last visit reviewed. Allergies and medications reviewed and updated.  Review of Systems  Constitutional: Negative.   Respiratory: Negative.    Cardiovascular: Negative.   Gastrointestinal: Negative.   Musculoskeletal:  Positive for arthralgias and gait problem. Negative for back pain, joint swelling, myalgias, neck pain and neck stiffness.  Skin: Negative.   Psychiatric/Behavioral: Negative.      Per HPI unless  specifically indicated above     Objective:    BP 123/82 (BP Location: Left Arm, Cuff Size: Normal)   Pulse 75   Temp 97.9 F (36.6 C) (Oral)   Wt 176 lb 8 oz (80.1 kg)   SpO2 98%   BMI 26.32 kg/m   Wt Readings from Last 3 Encounters:  07/31/22 176 lb 8 oz (80.1 kg)  06/13/21 164 lb 6.4 oz (74.6 kg)  12/04/20 163 lb 12.8 oz (74.3 kg)    Physical Exam Vitals and nursing note reviewed.  Constitutional:      General: He is not in acute distress.    Appearance: Normal appearance. He is not ill-appearing, toxic-appearing or diaphoretic.  HENT:     Head: Normocephalic and atraumatic.     Right Ear: External ear normal.     Left Ear: External ear normal.     Nose: Nose normal.     Mouth/Throat:     Mouth: Mucous membranes are moist.     Pharynx: Oropharynx is clear.  Eyes:     General: No scleral icterus.       Right eye: No discharge.        Left eye: No discharge.     Extraocular Movements: Extraocular movements intact.     Conjunctiva/sclera: Conjunctivae normal.     Pupils: Pupils are equal, round, and reactive to light.  Cardiovascular:     Rate and Rhythm: Normal rate and regular rhythm.     Pulses:  Normal pulses.     Heart sounds: Normal heart sounds. No murmur heard.    No friction rub. No gallop.  Pulmonary:     Effort: Pulmonary effort is normal. No respiratory distress.     Breath sounds: Normal breath sounds. No stridor. No wheezing, rhonchi or rales.  Chest:     Chest wall: No tenderness.  Musculoskeletal:        General: Tenderness (over deltoid ligament on R foot, no tenderness over ankle mortis) present. Normal range of motion.     Cervical back: Normal range of motion and neck supple.  Skin:    General: Skin is warm and dry.     Capillary Refill: Capillary refill takes less than 2 seconds.     Coloration: Skin is not jaundiced or pale.     Findings: No bruising, erythema, lesion or rash.  Neurological:     General: No focal deficit present.      Mental Status: He is alert and oriented to person, place, and time. Mental status is at baseline.  Psychiatric:        Mood and Affect: Mood normal.        Behavior: Behavior normal.        Thought Content: Thought content normal.        Judgment: Judgment normal.     Results for orders placed or performed in visit on 06/13/21  PSA  Result Value Ref Range   Prostate Specific Ag, Serum 0.8 0.0 - 4.0 ng/mL  TSH  Result Value Ref Range   TSH 2.730 0.450 - 4.500 uIU/mL  Lipid panel  Result Value Ref Range   Cholesterol, Total 197 100 - 199 mg/dL   Triglycerides 57 0 - 149 mg/dL   HDL 57 >39 mg/dL   VLDL Cholesterol Cal 11 5 - 40 mg/dL   LDL Chol Calc (NIH) 129 (H) 0 - 99 mg/dL   Chol/HDL Ratio 3.5 0.0 - 5.0 ratio  CBC with Differential/Platelet  Result Value Ref Range   WBC 5.0 3.4 - 10.8 x10E3/uL   RBC 5.54 4.14 - 5.80 x10E6/uL   Hemoglobin 16.5 13.0 - 17.7 g/dL   Hematocrit 49.8 37.5 - 51.0 %   MCV 90 79 - 97 fL   MCH 29.8 26.6 - 33.0 pg   MCHC 33.1 31.5 - 35.7 g/dL   RDW 13.1 11.6 - 15.4 %   Platelets 236 150 - 450 x10E3/uL   Neutrophils 48 Not Estab. %   Lymphs 40 Not Estab. %   Monocytes 9 Not Estab. %   Eos 2 Not Estab. %   Basos 1 Not Estab. %   Neutrophils Absolute 2.4 1.4 - 7.0 x10E3/uL   Lymphocytes Absolute 2.0 0.7 - 3.1 x10E3/uL   Monocytes Absolute 0.5 0.1 - 0.9 x10E3/uL   EOS (ABSOLUTE) 0.1 0.0 - 0.4 x10E3/uL   Basophils Absolute 0.0 0.0 - 0.2 x10E3/uL   Immature Granulocytes 0 Not Estab. %   Immature Grans (Abs) 0.0 0.0 - 0.1 x10E3/uL  Comprehensive metabolic panel  Result Value Ref Range   Glucose 97 65 - 99 mg/dL   BUN 17 6 - 24 mg/dL   Creatinine, Ser 1.17 0.76 - 1.27 mg/dL   eGFR 72 >59 mL/min/1.73   BUN/Creatinine Ratio 15 9 - 20   Sodium 141 134 - 144 mmol/L   Potassium 4.5 3.5 - 5.2 mmol/L   Chloride 101 96 - 106 mmol/L   CO2 26 20 - 29 mmol/L   Calcium 9.7 8.7 -  10.2 mg/dL   Total Protein 6.7 6.0 - 8.5 g/dL   Albumin 4.5 3.8 - 4.9 g/dL    Globulin, Total 2.2 1.5 - 4.5 g/dL   Albumin/Globulin Ratio 2.0 1.2 - 2.2   Bilirubin Total 0.7 0.0 - 1.2 mg/dL   Alkaline Phosphatase 104 44 - 121 IU/L   AST 31 0 - 40 IU/L   ALT 25 0 - 44 IU/L  Urinalysis, Routine w reflex microscopic  Result Value Ref Range   Specific Gravity, UA 1.015 1.005 - 1.030   pH, UA 6.5 5.0 - 7.5   Color, UA Yellow Yellow   Appearance Ur Clear Clear   Leukocytes,UA Negative Negative   Protein,UA Negative Negative/Trace   Glucose, UA Negative Negative   Ketones, UA Negative Negative   RBC, UA Negative Negative   Bilirubin, UA Negative Negative   Urobilinogen, Ur 0.2 0.2 - 1.0 mg/dL   Nitrite, UA Negative Negative      Assessment & Plan:   Problem List Items Addressed This Visit   None Visit Diagnoses     Acute right ankle pain    -  Primary   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 Offered x-ray, but [facility]. Appointment scheduled. Call with any concerns.    Relevant Orders   Ambulatory referral to Orthopedic Surgery   Needs flu shot       Flu shot given today.   Relevant Orders   Flu Vaccine QUAD 6+ mos PF IM (Fluarix Quad PF)        Follow up plan: Return As able for physical.

## 2022-08-13 ENCOUNTER — Encounter: Payer: Self-pay | Admitting: Family Medicine

## 2022-08-13 ENCOUNTER — Ambulatory Visit (INDEPENDENT_AMBULATORY_CARE_PROVIDER_SITE_OTHER): Payer: BC Managed Care – PPO | Admitting: Family Medicine

## 2022-08-13 VITALS — BP 122/85 | HR 57 | Temp 98.5°F | Ht 68.66 in | Wt 179.6 lb

## 2022-08-13 DIAGNOSIS — Z1211 Encounter for screening for malignant neoplasm of colon: Secondary | ICD-10-CM

## 2022-08-13 DIAGNOSIS — M791 Myalgia, unspecified site: Secondary | ICD-10-CM

## 2022-08-13 DIAGNOSIS — Z23 Encounter for immunization: Secondary | ICD-10-CM

## 2022-08-13 DIAGNOSIS — Z0000 Encounter for general adult medical examination without abnormal findings: Secondary | ICD-10-CM | POA: Diagnosis not present

## 2022-08-13 LAB — URINALYSIS, ROUTINE W REFLEX MICROSCOPIC
Bilirubin, UA: NEGATIVE
Glucose, UA: NEGATIVE
Ketones, UA: NEGATIVE
Leukocytes,UA: NEGATIVE
Nitrite, UA: NEGATIVE
Protein,UA: NEGATIVE
RBC, UA: NEGATIVE
Specific Gravity, UA: 1.015 (ref 1.005–1.030)
Urobilinogen, Ur: 0.2 mg/dL (ref 0.2–1.0)
pH, UA: 7 (ref 5.0–7.5)

## 2022-08-13 NOTE — Progress Notes (Signed)
BP 122/85   Pulse (!) 57   Temp 98.5 F (36.9 C) (Oral)   Ht 5' 8.66" (1.744 m)   Wt 179 lb 9.6 oz (81.5 kg)   SpO2 99%   BMI 26.78 kg/m    Subjective:    Patient ID: Stanley Ritter, male    DOB: 01/08/1961, 61 y.o.   MRN: [id number]  HPI: Stanley Ritter is a 61 y.o. male presenting on 08/13/2022 for comprehensive medical examination. Current medical complaints include: Recovering from his ankle sprain.  Interim Problems from his last visit: no  Depression Screen done today and results listed below:     08/13/2022   11:28 AM 07/31/2022    2:55 PM 06/13/2021   10:12 AM 12/04/2020   10:08 AM 11/19/2020   11:15 AM  Depression screen PHQ 2/9  Decreased Interest 0 0 0 0 0  Down, Depressed, Hopeless 0 0 0 0 0  PHQ - 2 Score 0 0 0 0 0  Altered sleeping 2 2 0    Tired, decreased energy 1 2 0    Change in appetite 0 0 0    Feeling bad or failure about yourself  0 0 0    Trouble concentrating 0 0 0    Moving slowly or fidgety/restless 0 0 0    Suicidal thoughts 0 0 0    PHQ-9 Score 3 4 0    Difficult doing work/chores Not difficult at all Not difficult at all       Past Medical History:  Past Medical History:  Diagnosis Date   Allergy    Dupuytren's contracture    Migraines     Surgical History:  Past Surgical History:  Procedure Laterality Date   dental implant     x2   HAND SURGERY Right 10/03/2021    Medications:  Current Outpatient Medications on File Prior to Visit  Medication Sig   Coenzyme Q10 (CO Q 10 PO) Take 1 tablet by mouth daily.   Multiple Vitamin (MULTI-VITAMINS) TABS Take 1 tablet by mouth daily.   Omega-3 Fatty Acids (FISH OIL PO) Take by mouth daily.   No current facility-administered medications on file prior to visit.    Allergies:  No Known Allergies  Social History:  Social History   Socioeconomic History   Marital status: Married    Spouse name: Not on file   Number of children: Not on file   Years of education: Not on file    Highest education level: Not on file  Occupational History   Not on file  Tobacco Use   Smoking status: Never   Smokeless tobacco: Never  Vaping Use   Vaping Use: Never used  Substance and Sexual Activity   Alcohol use: No    Alcohol/week: 0.0 standard drinks of alcohol   Drug use: No   Sexual activity: Not on file  Other Topics Concern   Not on file  Social History Narrative   Not on file   Social Determinants of Health   Financial Resource Strain: Not on file  Food Insecurity: Not on file  Transportation Needs: Not on file  Physical Activity: Not on file  Stress: Not on file  Social Connections: Not on file  Intimate Partner Violence: Not on file   Social History   Tobacco Use  Smoking Status Never  Smokeless Tobacco Never   Social History   Substance and Sexual Activity  Alcohol Use No   Alcohol/week: 0.0 standard drinks of alcohol  Family History:  Family History  Problem Relation Age of Onset   Stroke Maternal Grandmother    Heart disease Mother    Glaucoma Mother    Hypertension Mother    Heart disease Father    Glaucoma Father    Hypertension Father    Hypertension Sister    Hypertension Brother    Diabetes Maternal Grandfather    Hypertension Brother     Past medical history, surgical history, medications, allergies, family history and social history reviewed with patient today and changes made to appropriate areas of the chart.   Review of Systems  Constitutional:  Positive for chills. Negative for diaphoresis, fever, malaise/fatigue and weight loss.  HENT: Negative.    Eyes: Negative.   Respiratory:  Positive for shortness of breath (with heavy activity). Negative for cough, hemoptysis, sputum production and wheezing.   Cardiovascular: Negative.   Gastrointestinal: Negative.   Genitourinary: Negative.   Musculoskeletal:  Positive for joint pain and myalgias. Negative for back pain, falls and neck pain.  Skin: Negative.   Neurological:  Negative.   Endo/Heme/Allergies: Negative.   Psychiatric/Behavioral: Negative.     All other ROS negative except what is listed above and in the HPI.      Objective:    BP 122/85   Pulse (!) 57   Temp 98.5 F (36.9 C) (Oral)   Ht 5' 8.66" (1.744 m)   Wt 179 lb 9.6 oz (81.5 kg)   SpO2 99%   BMI 26.78 kg/m   Wt Readings from Last 3 Encounters:  08/13/22 179 lb 9.6 oz (81.5 kg)  07/31/22 176 lb 8 oz (80.1 kg)  06/13/21 164 lb 6.4 oz (74.6 kg)    Physical Exam Vitals and nursing note reviewed.  Constitutional:      General: He is not in acute distress.    Appearance: Normal appearance. He is normal weight. He is not ill-appearing, toxic-appearing or diaphoretic.  HENT:     Head: Normocephalic and atraumatic.     Right Ear: Tympanic membrane, ear canal and external ear normal. There is no impacted cerumen.     Left Ear: Tympanic membrane, ear canal and external ear normal. There is no impacted cerumen.     Nose: Nose normal. No congestion or rhinorrhea.     Mouth/Throat:     Mouth: Mucous membranes are moist.     Pharynx: Oropharynx is clear. No oropharyngeal exudate or posterior oropharyngeal erythema.  Eyes:     General: No scleral icterus.       Right eye: No discharge.        Left eye: No discharge.     Extraocular Movements: Extraocular movements intact.     Conjunctiva/sclera: Conjunctivae normal.     Pupils: Pupils are equal, round, and reactive to light.  Neck:     Vascular: No carotid bruit.  Cardiovascular:     Rate and Rhythm: Normal rate and regular rhythm.     Pulses: Normal pulses.     Heart sounds: No murmur heard.    No friction rub. No gallop.  Pulmonary:     Effort: Pulmonary effort is normal. No respiratory distress.     Breath sounds: Normal breath sounds. No stridor. No wheezing, rhonchi or rales.  Chest:     Chest wall: No tenderness.  Abdominal:     General: Abdomen is flat. Bowel sounds are normal. There is no distension.     Palpations:  Abdomen is soft. There is no mass.  Tenderness: There is no abdominal tenderness. There is no right CVA tenderness, left CVA tenderness, guarding or rebound.     Hernia: No hernia is present.  Genitourinary:    Comments: Genital exam deferred with shared decision making Musculoskeletal:        General: No swelling, tenderness, deformity or signs of injury.     Cervical back: Normal range of motion and neck supple. No rigidity. No muscular tenderness.     Right lower leg: No edema.     Left lower leg: No edema.  Lymphadenopathy:     Cervical: No cervical adenopathy.  Skin:    General: Skin is warm and dry.     Capillary Refill: Capillary refill takes less than 2 seconds.     Coloration: Skin is not jaundiced or pale.     Findings: No bruising, erythema, lesion or rash.  Neurological:     General: No focal deficit present.     Mental Status: He is alert and oriented to person, place, and time.     Cranial Nerves: No cranial nerve deficit.     Sensory: No sensory deficit.     Motor: No weakness.     Coordination: Coordination normal.     Gait: Gait normal.     Deep Tendon Reflexes: Reflexes normal.  Psychiatric:        Mood and Affect: Mood normal.        Behavior: Behavior normal.        Thought Content: Thought content normal.        Judgment: Judgment normal.     Results for orders placed or performed in visit on 06/13/21  PSA  Result Value Ref Range   Prostate Specific Ag, Serum 0.8 0.0 - 4.0 ng/mL  TSH  Result Value Ref Range   TSH 2.730 0.450 - 4.500 uIU/mL  Lipid panel  Result Value Ref Range   Cholesterol, Total 197 100 - 199 mg/dL   Triglycerides 57 0 - 149 mg/dL   HDL 57 >39 mg/dL   VLDL Cholesterol Cal 11 5 - 40 mg/dL   LDL Chol Calc (NIH) 129 (H) 0 - 99 mg/dL   Chol/HDL Ratio 3.5 0.0 - 5.0 ratio  CBC with Differential/Platelet  Result Value Ref Range   WBC 5.0 3.4 - 10.8 x10E3/uL   RBC 5.54 4.14 - 5.80 x10E6/uL   Hemoglobin 16.5 13.0 - 17.7 g/dL    Hematocrit 49.8 37.5 - 51.0 %   MCV 90 79 - 97 fL   MCH 29.8 26.6 - 33.0 pg   MCHC 33.1 31.5 - 35.7 g/dL   RDW 13.1 11.6 - 15.4 %   Platelets 236 150 - 450 x10E3/uL   Neutrophils 48 Not Estab. %   Lymphs 40 Not Estab. %   Monocytes 9 Not Estab. %   Eos 2 Not Estab. %   Basos 1 Not Estab. %   Neutrophils Absolute 2.4 1.4 - 7.0 x10E3/uL   Lymphocytes Absolute 2.0 0.7 - 3.1 x10E3/uL   Monocytes Absolute 0.5 0.1 - 0.9 x10E3/uL   EOS (ABSOLUTE) 0.1 0.0 - 0.4 x10E3/uL   Basophils Absolute 0.0 0.0 - 0.2 x10E3/uL   Immature Granulocytes 0 Not Estab. %   Immature Grans (Abs) 0.0 0.0 - 0.1 x10E3/uL  Comprehensive metabolic panel  Result Value Ref Range   Glucose 97 65 - 99 mg/dL   BUN 17 6 - 24 mg/dL   Creatinine, Ser 1.17 0.76 - 1.27 mg/dL   eGFR 72 >59 mL/min/1.73  BUN/Creatinine Ratio 15 9 - 20   Sodium 141 134 - 144 mmol/L   Potassium 4.5 3.5 - 5.2 mmol/L   Chloride 101 96 - 106 mmol/L   CO2 26 20 - 29 mmol/L   Calcium 9.7 8.7 - 10.2 mg/dL   Total Protein 6.7 6.0 - 8.5 g/dL   Albumin 4.5 3.8 - 4.9 g/dL   Globulin, Total 2.2 1.5 - 4.5 g/dL   Albumin/Globulin Ratio 2.0 1.2 - 2.2   Bilirubin Total 0.7 0.0 - 1.2 mg/dL   Alkaline Phosphatase 104 44 - 121 IU/L   AST 31 0 - 40 IU/L   ALT 25 0 - 44 IU/L  Urinalysis, Routine w reflex microscopic  Result Value Ref Range   Specific Gravity, UA 1.015 1.005 - 1.030   pH, UA 6.5 5.0 - 7.5   Color, UA Yellow Yellow   Appearance Ur Clear Clear   Leukocytes,UA Negative Negative   Protein,UA Negative Negative/Trace   Glucose, UA Negative Negative   Ketones, UA Negative Negative   RBC, UA Negative Negative   Bilirubin, UA Negative Negative   Urobilinogen, Ur 0.2 0.2 - 1.0 mg/dL   Nitrite, UA Negative Negative      Assessment & Plan:   Problem List Items Addressed This Visit   None Visit Diagnoses     Routine general medical examination at a health care facility    -  Primary   Vaccines up to date. Screening labs checked today.  Colonoscopy ordered. Continue diet and exercise. Call with any concerns.    Relevant Orders   Comprehensive metabolic panel   CBC with Differential/Platelet   Lipid Panel w/o Chol/HDL Ratio   PSA   TSH   Urinalysis, Routine w reflex microscopic   HIV Antibody (routine testing w rflx)   Hepatitis C Antibody   Myalgia       Will check vitamin D. Await results.    Relevant Orders   VITAMIN D 25 Hydroxy (Vit-D Deficiency, Fractures)   Screening for colon cancer       Referral to GI made today.   Relevant Orders   Ambulatory referral to Gastroenterology        LABORATORY TESTING:  Health maintenance labs ordered today as discussed above.   The natural history of prostate cancer and ongoing controversy regarding screening and potential treatment outcomes of prostate cancer has been discussed with the patient. The meaning of a false positive PSA and a false negative PSA has been discussed. He indicates understanding of the limitations of this screening test and wishes to proceed with screening PSA testing.   IMMUNIZATIONS:   - Tdap: Tetanus vaccination status reviewed: Tdap vaccination indicated and given today. - Influenza: Up to date - Pneumovax: Not applicable - Prevnar: Not applicable - COVID: Up to date - HPV: Not applicable - Shingrix vaccine: Up to date  SCREENING: - Colonoscopy: Ordered today  Discussed with patient purpose of the colonoscopy is to detect colon cancer at curable precancerous or early stages   PATIENT COUNSELING:    Sexuality: Discussed sexually transmitted diseases, partner selection, use of condoms, avoidance of unintended pregnancy  and contraceptive alternatives.   Advised to avoid cigarette smoking.  I discussed with the patient that most people either abstain from alcohol or drink within safe limits (<=14/week and <=4 drinks/occasion for males, <=7/weeks and <= 3 drinks/occasion for females) and that the risk for alcohol disorders and other health  effects rises proportionally with the number of drinks per week and  how often a drinker exceeds daily limits.  Discussed cessation/primary prevention of drug use and availability of treatment for abuse.   Diet: Encouraged to adjust caloric intake to maintain  or achieve ideal body weight, to reduce intake of dietary saturated fat and total fat, to limit sodium intake by avoiding high sodium foods and not adding table salt, and to maintain adequate dietary potassium and calcium preferably from fresh fruits, vegetables, and low-fat dairy products.    stressed the importance of regular exercise  Injury prevention: Discussed safety belts, safety helmets, smoke detector, smoking near bedding or upholstery.   Dental health: Discussed importance of regular tooth brushing, flossing, and dental visits.   Follow up plan: NEXT PREVENTATIVE PHYSICAL DUE IN 1 YEAR. Return in about 1 year (around 08/14/2023).

## 2022-08-14 LAB — COMPREHENSIVE METABOLIC PANEL
ALT: 51 IU/L — ABNORMAL HIGH (ref 0–44)
AST: 87 IU/L — ABNORMAL HIGH (ref 0–40)
Albumin/Globulin Ratio: 2.4 — ABNORMAL HIGH (ref 1.2–2.2)
Albumin: 4.7 g/dL (ref 3.8–4.9)
Alkaline Phosphatase: 96 IU/L (ref 44–121)
BUN/Creatinine Ratio: 15 (ref 10–24)
BUN: 17 mg/dL (ref 8–27)
Bilirubin Total: 0.8 mg/dL (ref 0.0–1.2)
CO2: 23 mmol/L (ref 20–29)
Calcium: 9.2 mg/dL (ref 8.6–10.2)
Chloride: 102 mmol/L (ref 96–106)
Creatinine, Ser: 1.12 mg/dL (ref 0.76–1.27)
Globulin, Total: 2 g/dL (ref 1.5–4.5)
Glucose: 89 mg/dL (ref 70–99)
Potassium: 4.2 mmol/L (ref 3.5–5.2)
Sodium: 140 mmol/L (ref 134–144)
Total Protein: 6.7 g/dL (ref 6.0–8.5)
eGFR: 75 mL/min/{1.73_m2} (ref >59)

## 2022-08-14 LAB — CBC WITH DIFFERENTIAL/PLATELET
Basophils Absolute: 0 10*3/uL (ref 0.0–0.2)
Basos: 1 %
EOS (ABSOLUTE): 0.1 10*3/uL (ref 0.0–0.4)
Eos: 1 %
Hematocrit: 48.3 % (ref 37.5–51.0)
Hemoglobin: 16.2 g/dL (ref 13.0–17.7)
Immature Grans (Abs): 0 10*3/uL (ref 0.0–0.1)
Immature Granulocytes: 0 %
Lymphocytes Absolute: 1.6 10*3/uL (ref 0.7–3.1)
Lymphs: 35 %
MCH: 30.2 pg (ref 26.6–33.0)
MCHC: 33.5 g/dL (ref 31.5–35.7)
MCV: 90 fL (ref 79–97)
Monocytes Absolute: 0.4 10*3/uL (ref 0.1–0.9)
Monocytes: 8 %
Neutrophils Absolute: 2.6 10*3/uL (ref 1.4–7.0)
Neutrophils: 55 %
Platelets: 218 10*3/uL (ref 150–450)
RBC: 5.37 x10E6/uL (ref 4.14–5.80)
RDW: 12.9 % (ref 11.6–15.4)
WBC: 4.7 10*3/uL (ref 3.4–10.8)

## 2022-08-14 LAB — HEPATITIS C ANTIBODY: Hep C Virus Ab: NONREACTIVE

## 2022-08-14 LAB — LIPID PANEL W/O CHOL/HDL RATIO
Cholesterol, Total: 203 mg/dL — ABNORMAL HIGH (ref 100–199)
HDL: 53 mg/dL (ref >39)
LDL Chol Calc (NIH): 137 mg/dL — ABNORMAL HIGH (ref 0–99)
Triglycerides: 71 mg/dL (ref 0–149)
VLDL Cholesterol Cal: 13 mg/dL (ref 5–40)

## 2022-08-14 LAB — HIV ANTIBODY (ROUTINE TESTING W REFLEX): HIV Screen 4th Generation wRfx: NONREACTIVE

## 2022-08-14 LAB — TSH: TSH: 1.64 u[IU]/mL (ref 0.450–4.500)

## 2022-08-14 LAB — VITAMIN D 25 HYDROXY (VIT D DEFICIENCY, FRACTURES): Vit D, 25-Hydroxy: 46 ng/mL (ref 30.0–100.0)

## 2022-08-14 LAB — PSA: Prostate Specific Ag, Serum: 0.6 ng/mL (ref 0.0–4.0)

## 2022-08-18 ENCOUNTER — Encounter: Payer: BC Managed Care – PPO | Admitting: Family Medicine

## 2022-08-19 ENCOUNTER — Encounter: Payer: Self-pay | Admitting: *Deleted

## 2022-10-08 IMAGING — US US ABDOMEN LIMITED
1 series · 15 of 25 positions shown · non-contrast
Comparison: None.

CLINICAL DATA: Right upper quadrant abdominal pain starting on
[REDACTED]. Also, palpable lump above the umbilicus.

EXAM:
ULTRASOUND ABDOMEN LIMITED RIGHT UPPER QUADRANT

[Series 1: us abdomen limited ruq · 15 of 52 slices shown]
[im 1/52]
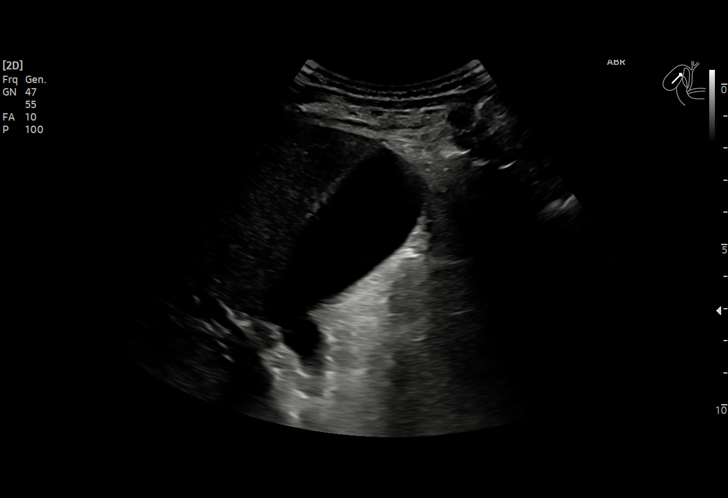
[im 5/52]
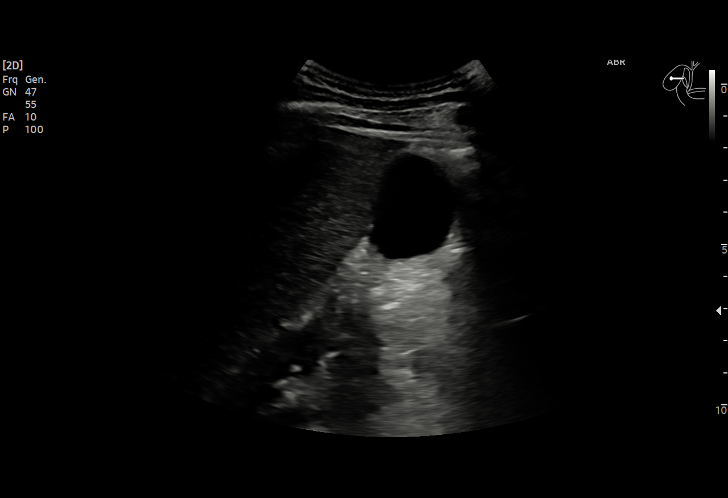
[im 9/52]
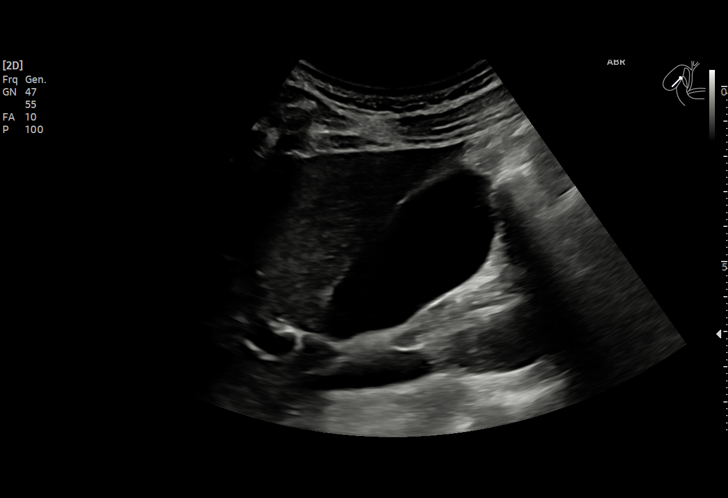
[im 11/52]
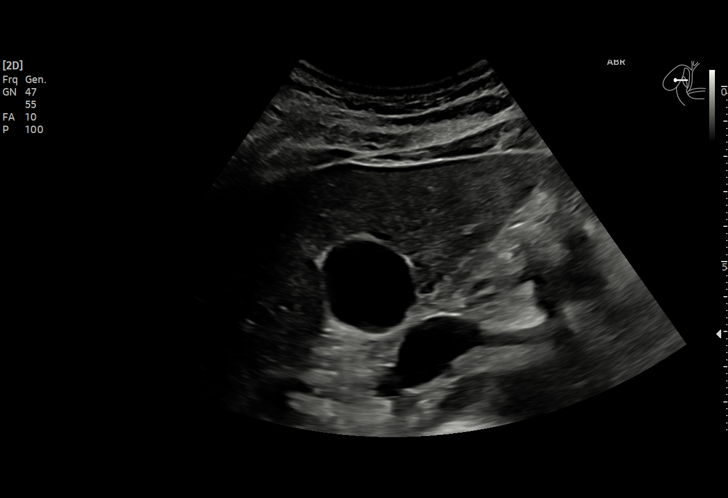
[im 15/52]
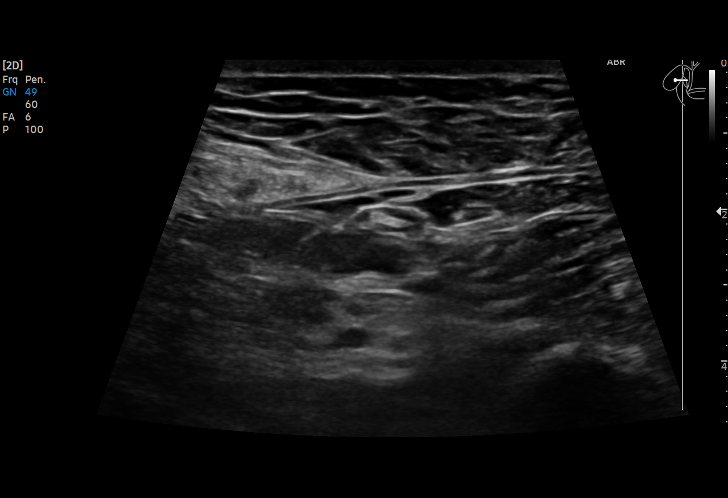
[im 20/52]
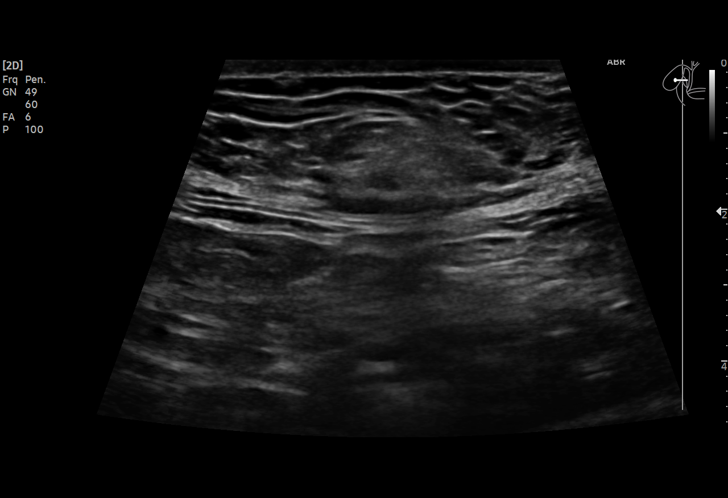
[im 22/52]
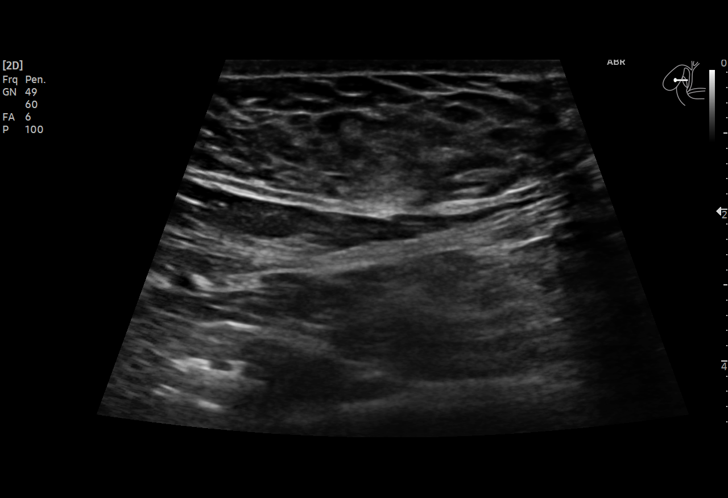
[im 26/52]
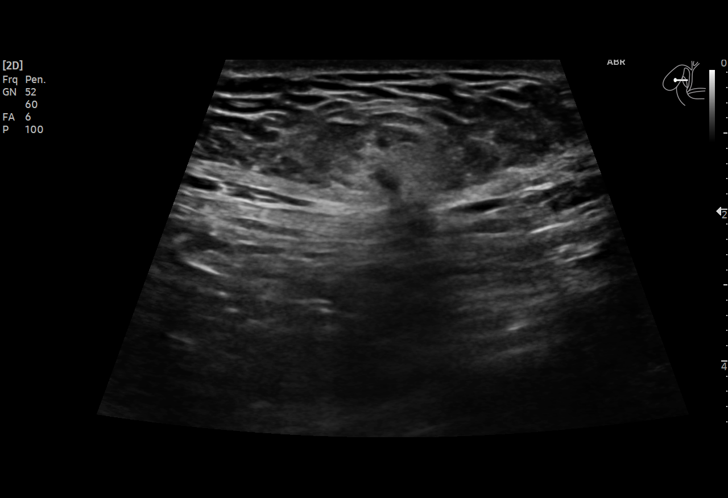
[im 30/52]
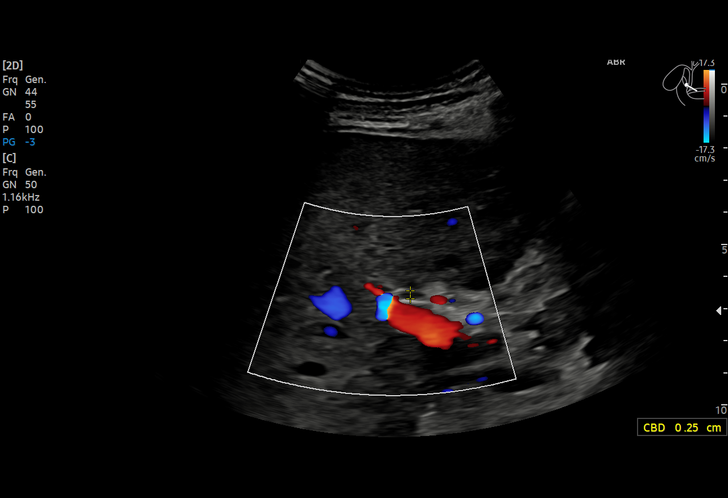
[im 32/52]
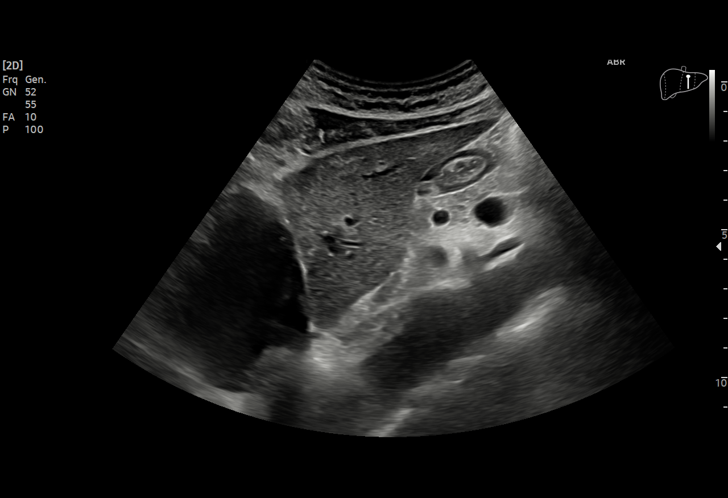
[im 37/52]
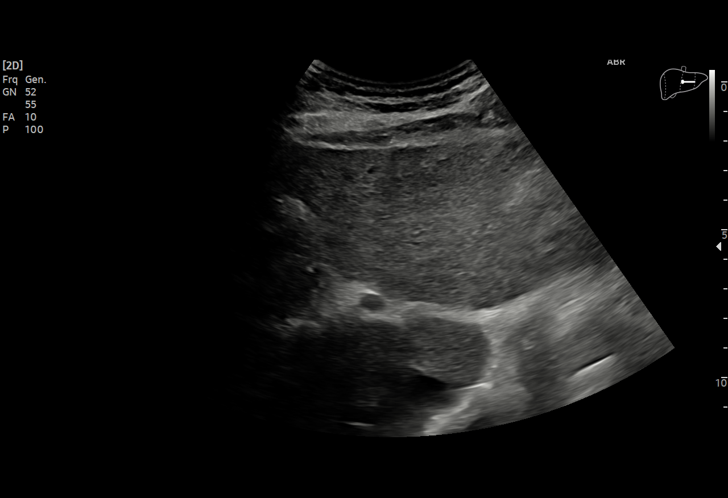
[im 41/52]
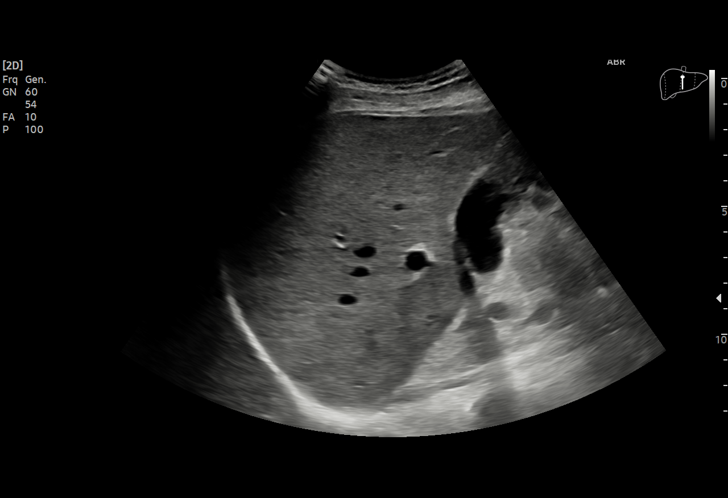
[im 43/52]
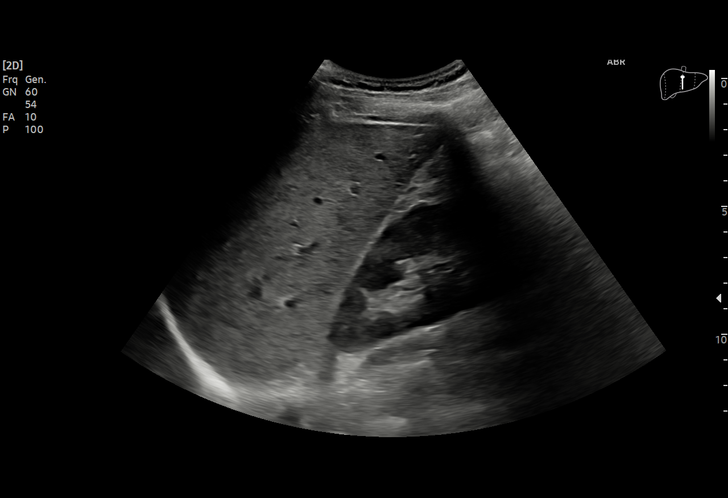
[im 47/52]
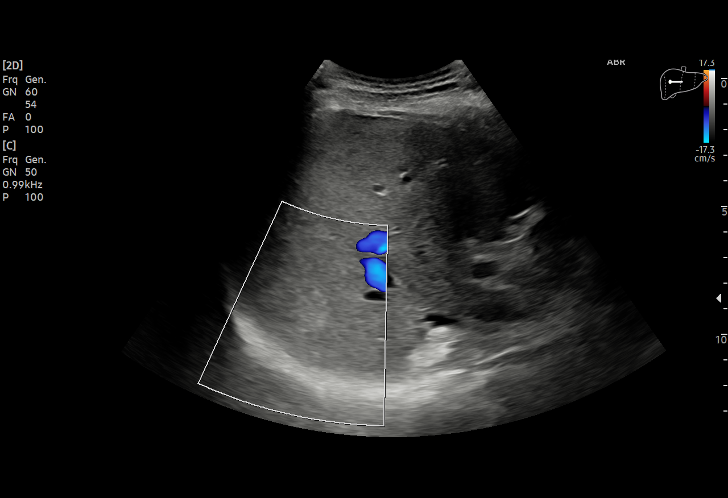
[im 52/52]
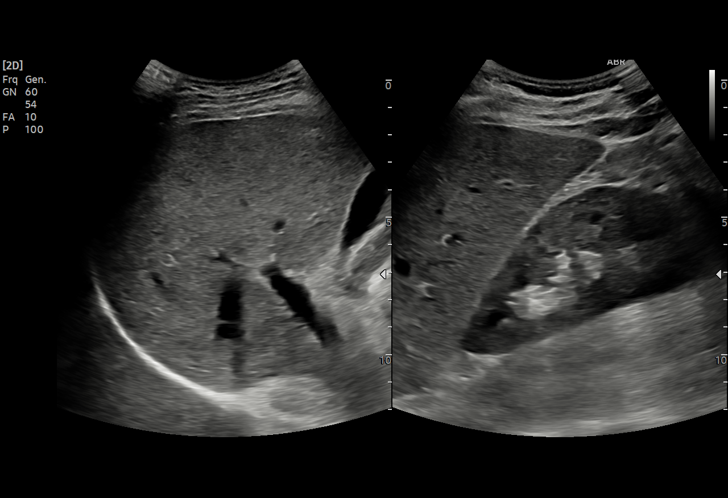

[15 of 25 positions shown; findings below may reference images not displayed]

FINDINGS: Gallbladder:

No gallstones or wall thickening visualized. No sonographic Murphy
sign noted by sonographer.

Common bile duct:

Diameter: 0.3 cm

Liver:

Subtle hyperechoic 1.3 by 1.2 by 1.2 cm lesion in the right hepatic
lobe shown on image 46 of the first series. Otherwise normal hepatic
echogenicity. Within normal limits in parenchymal echogenicity.
Portal vein is patent on color Doppler imaging with normal direction
of blood flow towards the liver.

Other: Focused evaluation of the palpable region just above the
umbilicus demonstrates the 2.6 by 1.4 by 1.8 cm mildly hyperechoic
lesion probably representing herniated omental adipose tissue
through a small hernia neck. The patient was tender along this
region. No definite gut signature to suggest herniated bowel.
IMPRESSION: 1. Mildly hyperechoic 1.3 cm lesion in the right hepatic lobe.
Although not entirely specific, the most common cause for such an
appearance would be a hemangioma. If the patient has a history of
abnormal liver enzymes, history of gastrointestinal malignancy, or
if otherwise clinically indicated, hepatic protocol MRI with and
without contrast could be utilized to further characterize this
hepatic lesion.
2. Small supraumbilical hernia contains adipose tissue, with some
tenderness along the region of involvement.

## 2022-12-31 ENCOUNTER — Telehealth: Payer: Self-pay

## 2022-12-31 ENCOUNTER — Other Ambulatory Visit: Payer: Self-pay

## 2022-12-31 DIAGNOSIS — Z1211 Encounter for screening for malignant neoplasm of colon: Secondary | ICD-10-CM

## 2022-12-31 MED ORDER — NA SULFATE-K SULFATE-MG SULF 17.5-3.13-1.6 GM/177ML PO SOLN: 1.0000 | Freq: Once | ORAL | 0 refills | Status: AC

## 2022-12-31 NOTE — Telephone Encounter (Signed)
Gastroenterology Pre-Procedure Review  Request Date: 01/21/23 Requesting Physician: Dr. Marius Ditch  PATIENT REVIEW QUESTIONS: The patient responded to the following health history questions as indicated:    1. Are you having any GI issues? no 2. Do you have a personal history of Polyps? no 3. Do you have a family history of Colon Cancer or Polyps? no 4. Diabetes Mellitus? no 5. Joint replacements in the past 12 months?no 6. Major health problems in the past 3 months?no 7. Any artificial heart valves, MVP, or defibrillator?no    MEDICATIONS & ALLERGIES:    Patient reports the following regarding taking any anticoagulation/antiplatelet therapy:   Plavix, Coumadin, Eliquis, Xarelto, Lovenox, Pradaxa, Brilinta, or Effient? no Aspirin? no  Patient confirms/reports the following medications:  Current Outpatient Medications  Medication Sig Dispense Refill   Coenzyme Q10 (CO Q 10 PO) Take 1 tablet by mouth daily.     Multiple Vitamin (MULTI-VITAMINS) TABS Take 1 tablet by mouth daily.     Omega-3 Fatty Acids (FISH OIL PO) Take by mouth daily.     No current facility-administered medications for this visit.    Patient confirms/reports the following allergies:  No Known Allergies  No orders of the defined types were placed in this encounter.   AUTHORIZATION INFORMATION Primary Insurance: 1D#: Group #:  Secondary Insurance: 1D#: Group #:  SCHEDULE INFORMATION: Date: 01/21/23 Time: Location: [place]

## 2022-12-31 NOTE — Telephone Encounter (Signed)
Pt would like to have procedure on a Monday . He wold like for you to call hm back tp r/s it.

## 2022-12-31 NOTE — Telephone Encounter (Signed)
Received message patient would like to reschedule colonoscopy to a Monday.  Returned phone call left voice message for him to call back to reschedule.  Thanks,  [place], [place]

## 2023-01-11 ENCOUNTER — Encounter: Payer: Self-pay | Admitting: Gastroenterology

## 2023-01-21 ENCOUNTER — Ambulatory Visit: Payer: BC Managed Care – PPO | Admitting: Anesthesiology

## 2023-01-21 ENCOUNTER — Other Ambulatory Visit: Payer: Self-pay

## 2023-01-21 ENCOUNTER — Encounter: Payer: Self-pay | Admitting: Gastroenterology

## 2023-01-21 ENCOUNTER — Ambulatory Visit
Admission: RE | Admit: 2023-01-21 | Discharge: 2023-01-21 | Disposition: A | Discharge status: Home / Self Care | Payer: BC Managed Care – PPO | Attending: Gastroenterology | Admitting: Gastroenterology

## 2023-01-21 ENCOUNTER — Encounter
Admission: RE | Disposition: A | Discharge status: Home / Self Care | Payer: Self-pay | Source: Home / Self Care | Attending: Gastroenterology

## 2023-01-21 DIAGNOSIS — Z1211 Encounter for screening for malignant neoplasm of colon: Secondary | ICD-10-CM | POA: Insufficient documentation

## 2023-01-21 DIAGNOSIS — K644 Residual hemorrhoidal skin tags: Secondary | ICD-10-CM | POA: Diagnosis not present

## 2023-01-21 HISTORY — PX: COLONOSCOPY WITH PROPOFOL: SHX5780

## 2023-01-21 SURGERY — COLONOSCOPY WITH PROPOFOL
Anesthesia: General | Site: Rectum

## 2023-01-21 MED ORDER — STERILE WATER FOR IRRIGATION IR SOLN
Status: DC | PRN
  Administered 2023-01-21: 150 mL

## 2023-01-21 MED ORDER — ACETAMINOPHEN 160 MG/5ML PO SOLN: 325.0000 mg | ORAL | Status: DC | PRN

## 2023-01-21 MED ORDER — SODIUM CHLORIDE 0.9 % IV SOLN: INTRAVENOUS | Status: DC

## 2023-01-21 MED ORDER — ACETAMINOPHEN 325 MG PO TABS: 650.0000 mg | ORAL_TABLET | Freq: Once | ORAL | Status: DC | PRN

## 2023-01-21 MED ORDER — ONDANSETRON HCL 4 MG/2ML IJ SOLN: 4.0000 mg | Freq: Once | INTRAMUSCULAR | Status: DC | PRN

## 2023-01-21 MED ORDER — LACTATED RINGERS IV SOLN: INTRAVENOUS | Status: DC

## 2023-01-21 MED ORDER — LIDOCAINE HCL (CARDIAC) PF 100 MG/5ML IV SOSY
PREFILLED_SYRINGE | INTRAVENOUS | Status: DC | PRN
  Administered 2023-01-21: 80 mg via INTRAVENOUS

## 2023-01-21 MED ORDER — PROPOFOL 10 MG/ML IV BOLUS
INTRAVENOUS | Status: DC | PRN
  Administered 2023-01-21: 20 mg via INTRAVENOUS
  Administered 2023-01-21 (×2): 40 mg via INTRAVENOUS
  Administered 2023-01-21: 20 mg via INTRAVENOUS
  Administered 2023-01-21: 50 mg via INTRAVENOUS
  Administered 2023-01-21: 40 mg via INTRAVENOUS
  Administered 2023-01-21: 20 mg via INTRAVENOUS
  Administered 2023-01-21: 40 mg via INTRAVENOUS

## 2023-01-21 SURGICAL SUPPLY — 25 items

## 2023-01-21 NOTE — H&P (Signed)
  Cephas Darby, MD [address]  [place]  [place], [place] [address]  Main: [phone]  Fax: [phone] Pager: [phone]  Primary Care Physician:  Valerie Roys, DO Primary Gastroenterologist:  Dr. Cephas Darby  Pre-Procedure History & Physical: HPI:  Stanley Ritter is a 62 y.o. male is here for an colonoscopy.   Past Medical History:  Diagnosis Date   Allergy    Dupuytren's contracture    Migraines     Past Surgical History:  Procedure Laterality Date   dental implant     x2   HAND SURGERY Right 10/03/2021    Prior to Admission medications   Medication Sig Start Date End Date Taking? Authorizing Provider  Coenzyme Q10 (CO Q 10 PO) Take 1 tablet by mouth daily.    [provider]  Multiple Vitamin (MULTI-VITAMINS) TABS Take 1 tablet by mouth daily.    [provider]  Omega-3 Fatty Acids (FISH OIL PO) Take by mouth daily.    [provider]    Allergies as of 12/31/2022   (No Known Allergies)    Family History  Problem Relation Age of Onset   Stroke Maternal Grandmother    Heart disease Mother    Glaucoma Mother    Hypertension Mother    Heart disease Father    Glaucoma Father    Hypertension Father    Hypertension Sister    Hypertension Brother    Diabetes Maternal Grandfather    Hypertension Brother     Social History   Socioeconomic History   Marital status: Married    Spouse name: Not on file   Number of children: Not on file   Years of education: Not on file   Highest education level: Not on file  Occupational History   Not on file  Tobacco Use   Smoking status: Never   Smokeless tobacco: Never  Vaping Use   Vaping Use: Never used  Substance and Sexual Activity   Alcohol use: No    Alcohol/week: 0.0 standard drinks of alcohol   Drug use: No   Sexual activity: Not on file  Other Topics Concern   Not on file  Social History Narrative   Not on file   Social Determinants of Health    Financial Resource Strain: Not on file  Food Insecurity: Not on file  Transportation Needs: Not on file  Physical Activity: Not on file  Stress: Not on file  Social Connections: Not on file  Intimate Partner Violence: Not on file    Review of Systems: See HPI, otherwise negative ROS  Physical Exam: Ht 5\' 9"  (1.753 m)   Wt 79.4 kg   BMI 25.84 kg/m  General:   Alert,  pleasant and cooperative in NAD Head:  Normocephalic and atraumatic. Neck:  Supple; no masses or thyromegaly. Lungs:  Clear throughout to auscultation.    Heart:  Regular rate and rhythm. Abdomen:  Soft, nontender and nondistended. Normal bowel sounds, without guarding, and without rebound.   Neurologic:  Alert and  oriented x4;  grossly normal neurologically.  Impression/Plan: Stanley Ritter is here for an colonoscopy to be performed for colon cancer screening  Risks, benefits, limitations, and alternatives regarding  colonoscopy have been reviewed with the patient.  Questions have been answered.  All parties agreeable.   Sherri Sear, MD  01/21/2023, 8:07 AM

## 2023-01-21 NOTE — Anesthesia Postprocedure Evaluation (Signed)
Anesthesia Post Note  Patient: Stanley Ritter  Procedure(s) Performed: COLONOSCOPY WITH PROPOFOL (Rectum)  Patient location during evaluation: PACU Anesthesia Type: General Level of consciousness: awake and alert, oriented and patient cooperative Pain management: pain level controlled Vital Signs Assessment: post-procedure vital signs reviewed and stable Respiratory status: spontaneous breathing, nonlabored ventilation and respiratory function stable Cardiovascular status: blood pressure returned to baseline and stable Postop Assessment: adequate PO intake Anesthetic complications: no   No notable events documented.   Last Vitals:  Vitals:   01/21/23 0915 01/21/23 0930  BP: 105/75 (!) 121/96  Pulse: 66 (!) 55  Resp: 16 14  Temp:    SpO2: 95% 100%    Last Pain:  Vitals:   01/21/23 0930  PainSc: 0-No pain                 Darrin Nipper

## 2023-01-21 NOTE — Op Note (Signed)
[facility] Patient Name: Stanley Ritter Procedure Date: 01/21/2023 8:41 AM MRN: [id number] Account #: [id number] Date of Birth: [birth year] Admit Type: Outpatient Age: 62 Room: [facility] OR ROOM 01 Gender: Male Note Status: Finalized Instrument Name: [id number] Procedure:             Colonoscopy Indications:           Screening for colorectal malignant neoplasm, This is                         the patient's first colonoscopy Providers:             Lin Landsman MD, MD Referring MD:          Valerie Roys (Referring MD) Medicines:             General Anesthesia Complications:         No immediate complications. Estimated blood loss: None. Procedure:             Pre-Anesthesia Assessment:                        - Prior to the procedure, a History and Physical was                         performed, and patient medications and allergies were                         reviewed. The patient is competent. The risks and                         benefits of the procedure and the sedation options and                         risks were discussed with the patient. All questions                         were answered and informed consent was obtained.                         Patient identification and proposed procedure were                         verified by the physician, the nurse, the                         anesthesiologist, the anesthetist and the technician                         in the pre-procedure area in the procedure room in the                         endoscopy suite. Mental Status Examination: alert and                         oriented. Airway Examination: normal oropharyngeal                         airway and neck mobility. Respiratory Examination:  clear to auscultation. CV Examination: normal.                         Prophylactic Antibiotics: The patient does not require                         prophylactic antibiotics.  Prior Anticoagulants: The                         patient has taken no anticoagulant or antiplatelet                         agents. ASA Grade Assessment: I - A normal, healthy                         patient. After reviewing the risks and benefits, the                         patient was deemed in satisfactory condition to                         undergo the procedure. The anesthesia plan was to use                         general anesthesia. Immediately prior to                         administration of medications, the patient was                         re-assessed for adequacy to receive sedatives. The                         heart rate, respiratory rate, oxygen saturations,                         blood pressure, adequacy of pulmonary ventilation, and                         response to care were monitored throughout the                         procedure. The physical status of the patient was                         re-assessed after the procedure.                        After obtaining informed consent, the colonoscope was                         passed under direct vision. Throughout the procedure,                         the patient's blood pressure, pulse, and oxygen                         saturations were monitored continuously. The  Colonoscope was introduced through the anus and                         advanced to the the cecum, identified by appendiceal                         orifice and ileocecal valve. The colonoscopy was                         performed without difficulty. The patient tolerated                         the procedure well. The quality of the bowel                         preparation was evaluated using the BBPS [facility] Bowel                         Preparation Scale) with scores of: Right Colon = 3,                         Transverse Colon = 3 and Left Colon = 3 (entire mucosa                         seen well with no residual  staining, small fragments                         of stool or opaque liquid). The total BBPS score                         equals 9. The ileocecal valve, appendiceal orifice,                         and rectum were photographed. Findings:      The perianal and digital rectal examinations were normal. Pertinent       negatives include normal sphincter tone and no palpable rectal lesions.      The entire examined colon appeared normal.      The retroflexed view of the distal rectum and anal verge was normal and       showed no anal or rectal abnormalities. Impression:            - The entire examined colon is normal.                        - The distal rectum and anal verge are normal on                         retroflexion view.                        - No specimens collected. Recommendation:        - Discharge patient to home (with escort).                        - Resume previous diet today.                        -  Continue present medications.                        - Repeat colonoscopy in 10 years for screening                         purposes. Procedure Code(s):     --- Professional ---                        [id number], Colorectal cancer screening; colonoscopy on                         individual not meeting criteria for high risk Diagnosis Code(s):     --- Professional ---                        Z12.11, Encounter for screening for malignant neoplasm                         of colon CPT copyright 2022 American Medical Association. All rights reserved. The codes documented in this report are preliminary and upon coder review may  be revised to meet current compliance requirements. Dr. Ulyess Mort Lin Landsman MD, MD 01/21/2023 9:09:13 AM This report has been signed electronically. Number of Addenda: 0 Note Initiated On: 01/21/2023 8:41 AM Scope Withdrawal Time: 0 hours 7 minutes 51 seconds  Total Procedure Duration: 0 hours 14 minutes 5 seconds  Estimated Blood Loss:   Estimated blood loss: none.      [facility]

## 2023-01-21 NOTE — Transfer of Care (Signed)
Immediate Anesthesia Transfer of Care Note  Patient: Stanley Ritter  Procedure(s) Performed: COLONOSCOPY WITH PROPOFOL (Rectum)  Patient Location: PACU  Anesthesia Type: General  Level of Consciousness: awake, alert  and patient cooperative  Airway and Oxygen Therapy: Patient Spontanous Breathing and Patient connected to supplemental oxygen  Post-op Assessment: Post-op Vital signs reviewed, Patient's Cardiovascular Status Stable, Respiratory Function Stable, Patent Airway and No signs of Nausea or vomiting  Post-op Vital Signs: Reviewed and stable  Complications: No notable events documented.

## 2023-01-21 NOTE — Anesthesia Preprocedure Evaluation (Addendum)
Anesthesia Evaluation  Patient identified by MRN, date of birth, ID band Patient awake    Reviewed: Allergy & Precautions, NPO status , Patient's Chart, lab work & pertinent test results  History of Anesthesia Complications Negative for: history of anesthetic complications  Airway Mallampati: IV   Neck ROM: Full    Dental  (+) Missing   Pulmonary neg pulmonary ROS   Pulmonary exam normal breath sounds clear to auscultation       Cardiovascular Exercise Tolerance: Good negative cardio ROS Normal cardiovascular exam Rhythm:Regular Rate:Normal     Neuro/Psych  Headaches    GI/Hepatic negative GI ROS,,,  Endo/Other  negative endocrine ROS    Renal/GU negative Renal ROS     Musculoskeletal   Abdominal   Peds  Hematology negative hematology ROS (+)   Anesthesia Other Findings   Reproductive/Obstetrics                             Anesthesia Physical Anesthesia Plan  ASA: 1  Anesthesia Plan: General   Post-op Pain Management:    Induction: Intravenous  PONV Risk Score and Plan: 2 and Propofol infusion, TIVA and Treatment may vary due to age or medical condition  Airway Management Planned: Natural Airway  Additional Equipment:   Intra-op Plan:   Post-operative Plan:   Informed Consent: I have reviewed the patients History and Physical, chart, labs and discussed the procedure including the risks, benefits and alternatives for the proposed anesthesia with the patient or authorized representative who has indicated his/her understanding and acceptance.       Plan Discussed with: CRNA  Anesthesia Plan Comments: (LMA/GETA backup discussed.  Patient consented for risks of anesthesia including but not limited to:  - adverse reactions to medications - damage to eyes, teeth, lips or other oral mucosa - nerve damage due to positioning  - sore throat or hoarseness - damage to heart,  brain, nerves, lungs, other parts of body or loss of life  Informed patient about role of CRNA in peri- and intra-operative care.  Patient voiced understanding.)       Anesthesia Quick Evaluation

## 2023-01-22 ENCOUNTER — Encounter: Payer: Self-pay | Admitting: Gastroenterology

## 2023-01-22 ENCOUNTER — Telehealth: Payer: Self-pay

## 2023-01-22 NOTE — Telephone Encounter (Signed)
We received [facility] for patient colonoscopy paper work. Called and left a message for call back because we do not fill out [facility] for a colonoscopy visit.

## 2023-01-22 NOTE — Telephone Encounter (Signed)
Informed patient we could do a letter for him and he said no that is fine he will tell his work that information.

## 2023-04-29 IMAGING — CR DG HIP (WITH OR WITHOUT PELVIS) 2-3V*R*
1 series · 3 of 3 positions shown · non-contrast
Comparison: None.

CLINICAL DATA: Atraumatic, intermittent bilateral hip pain x1
month.

EXAM:
DG HIP (WITH OR WITHOUT PELVIS) 2-3V RIGHT

[Series 1: dg hip unilat w or w/o pelvis 2-3 views  · non-contrast · 0.14mm/px · 3 of 3 slices shown]
[im 1/3]
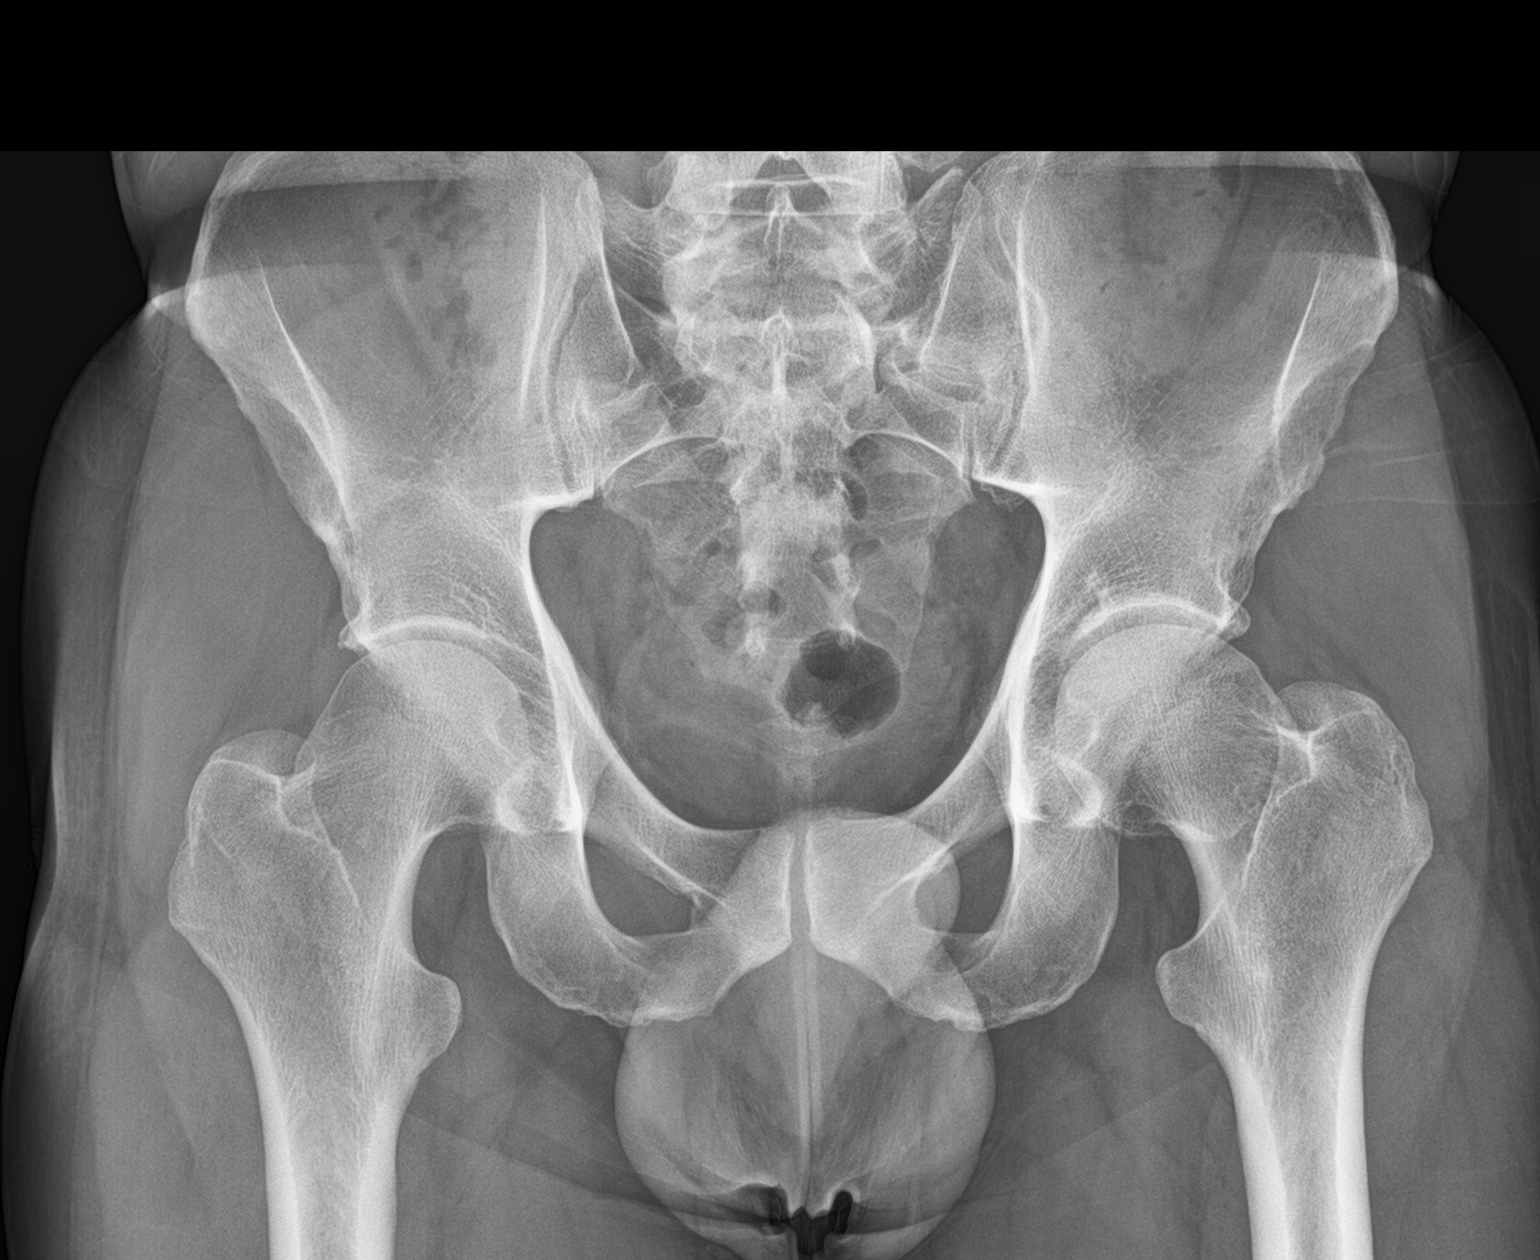
[im 2/3]
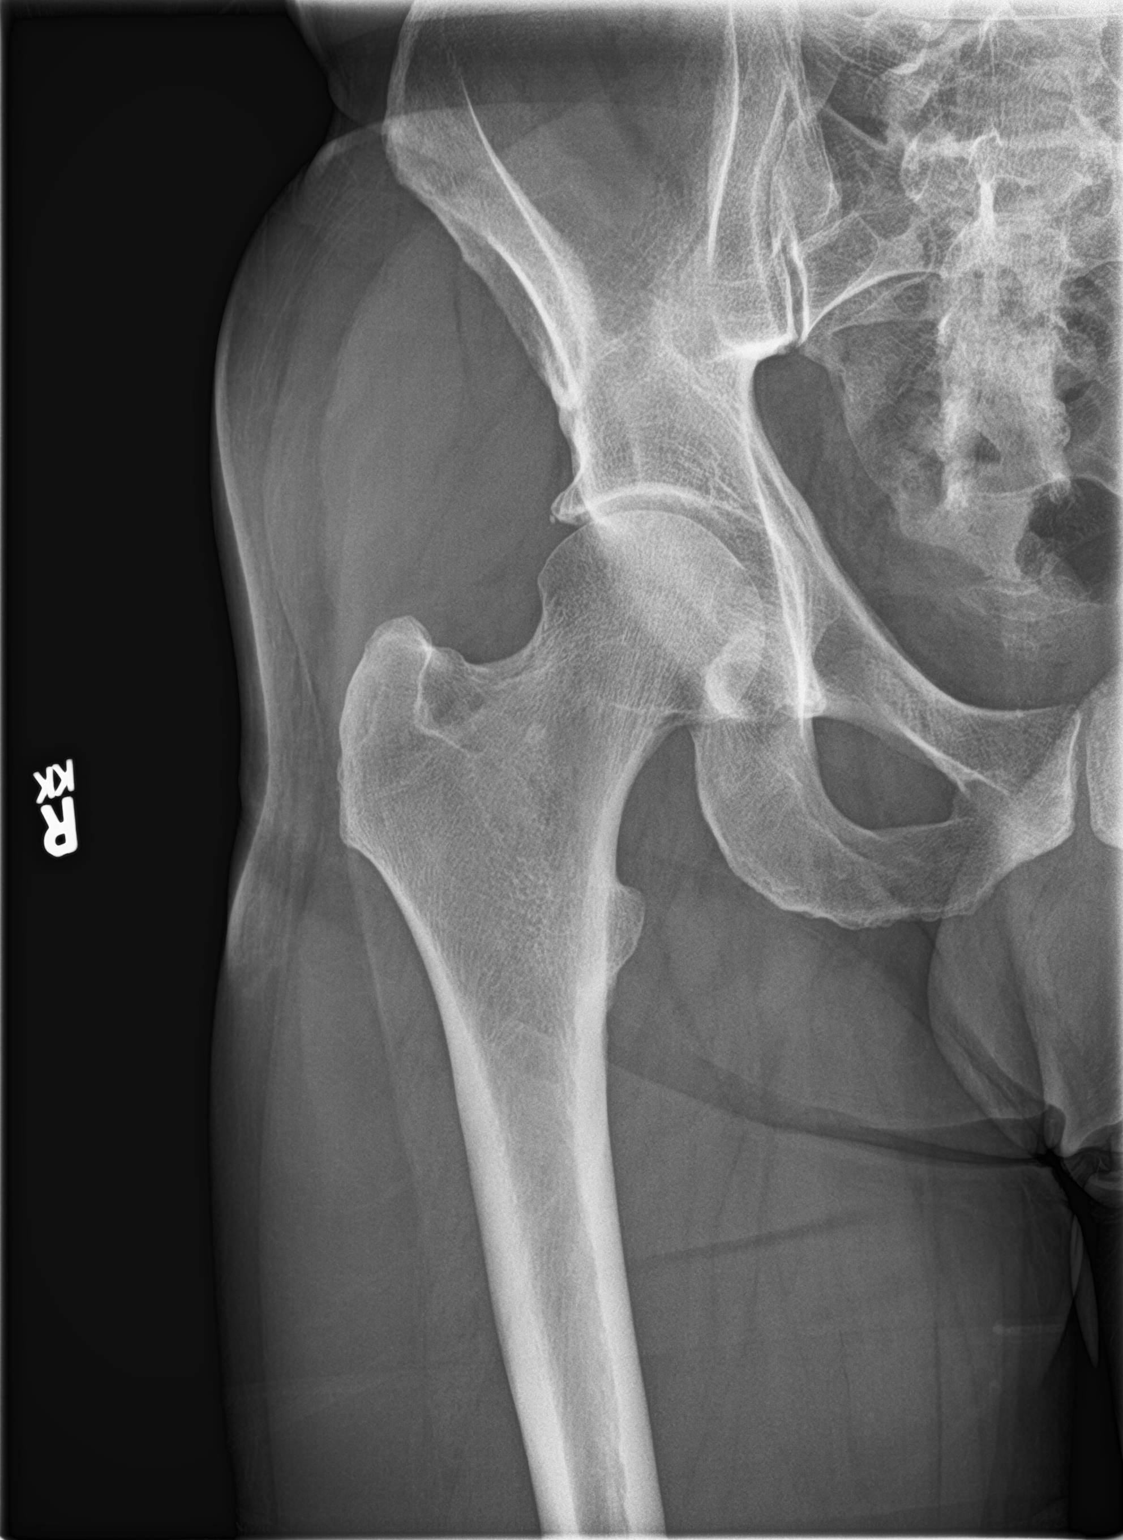
[im 3/3]
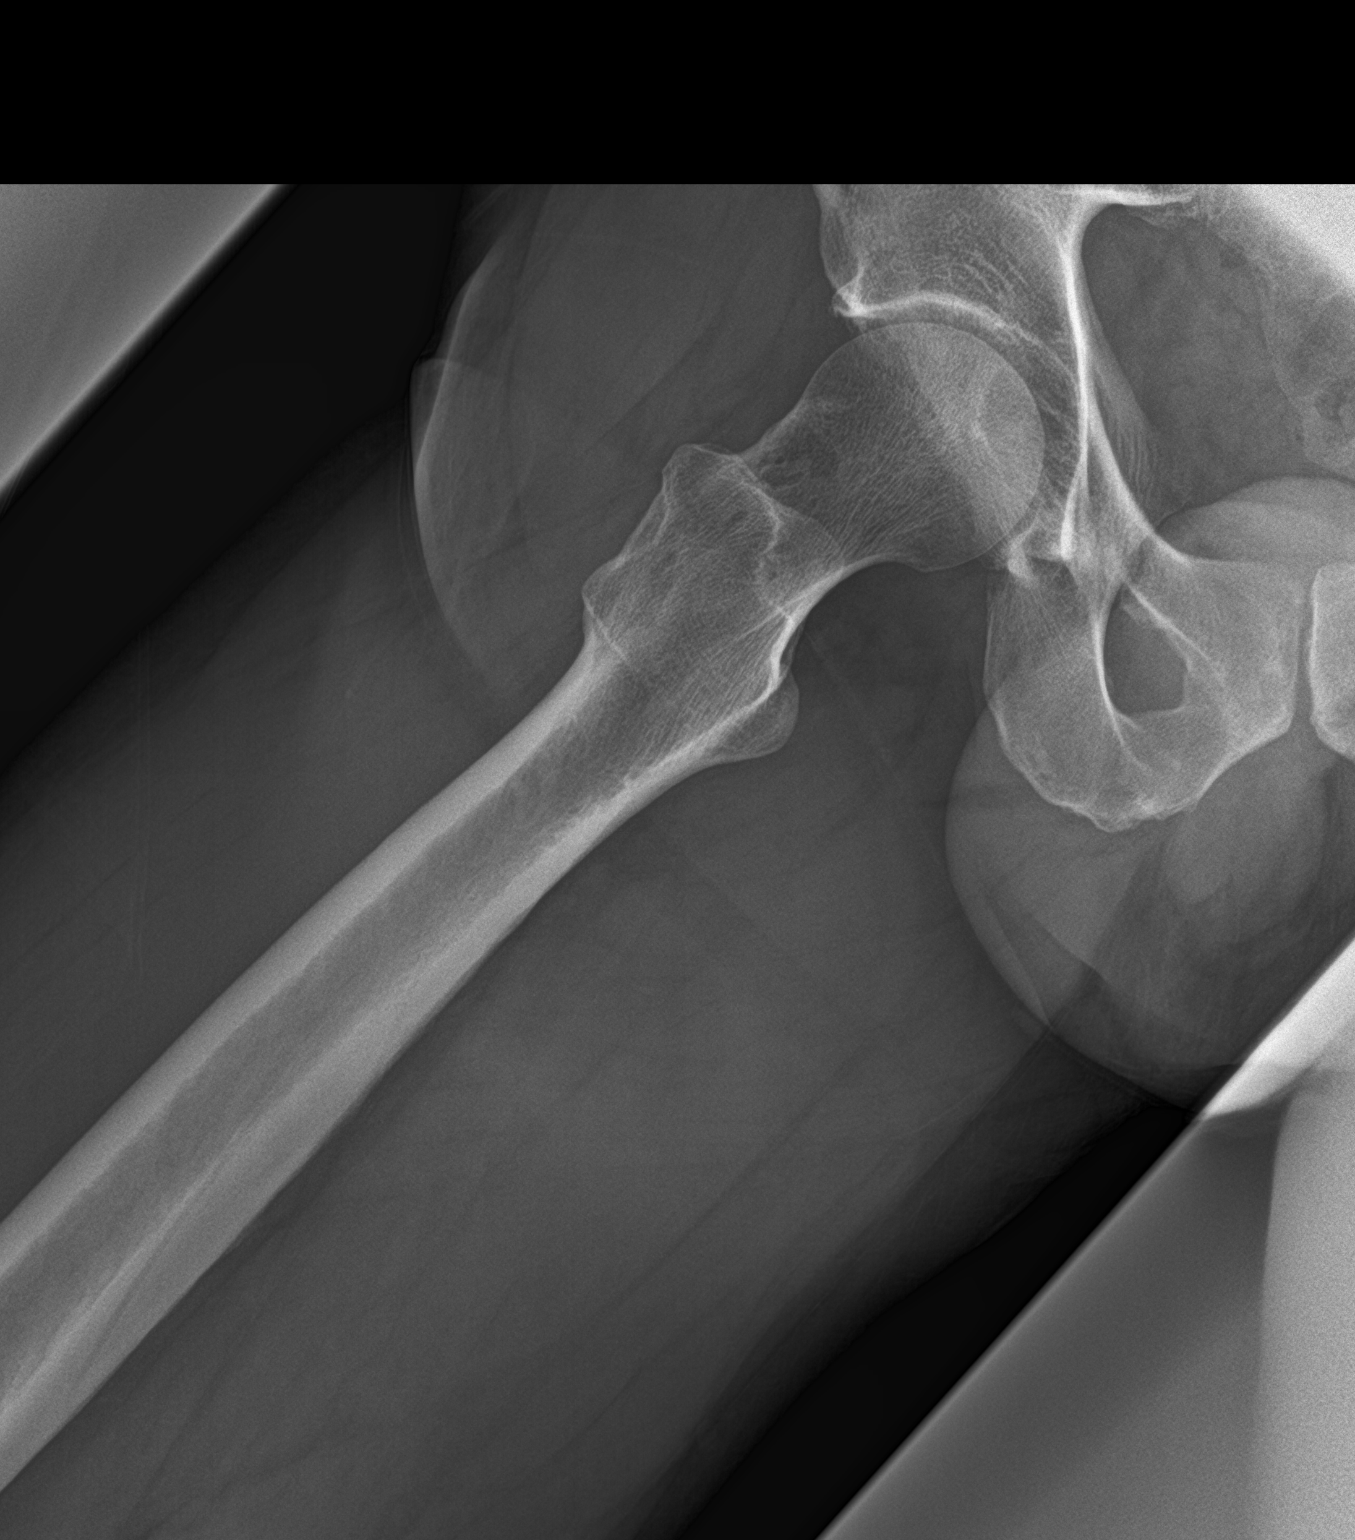

[3 of 3 positions shown; findings below may reference images not displayed]

FINDINGS: There is no evidence of hip fracture or dislocation. There is no
evidence of arthropathy or other focal bone abnormality.
IMPRESSION: Negative.

## 2023-05-02 ENCOUNTER — Ambulatory Visit
Admission: RE | Admit: 2023-05-02 | Discharge: 2023-05-02 | Disposition: A | Discharge status: Home / Self Care | Payer: BC Managed Care – PPO | Source: Ambulatory Visit | Attending: Family Medicine | Admitting: Family Medicine

## 2023-05-02 DIAGNOSIS — S92515A Nondisplaced fracture of proximal phalanx of left lesser toe(s), initial encounter for closed fracture: Secondary | ICD-10-CM | POA: Diagnosis not present

## 2023-05-05 ENCOUNTER — Encounter: Payer: Self-pay | Admitting: Physician Assistant

## 2023-05-05 ENCOUNTER — Telehealth: Payer: Self-pay | Admitting: Family Medicine

## 2023-05-05 ENCOUNTER — Ambulatory Visit: Payer: BC Managed Care – PPO | Admitting: Physician Assistant

## 2023-05-05 VITALS — BP 126/78 | HR 69 | Temp 98.0°F | Ht 69.0 in | Wt 171.8 lb

## 2023-05-05 DIAGNOSIS — Z0289 Encounter for other administrative examinations: Secondary | ICD-10-CM

## 2023-05-05 DIAGNOSIS — S92515D Nondisplaced fracture of proximal phalanx of left lesser toe(s), subsequent encounter for fracture with routine healing: Secondary | ICD-10-CM | POA: Diagnosis not present

## 2023-05-05 DIAGNOSIS — S92515S Nondisplaced fracture of proximal phalanx of left lesser toe(s), sequela: Secondary | ICD-10-CM

## 2023-05-05 NOTE — Telephone Encounter (Signed)
Patient has been notified that [facility] paperwork has been completed and faxed.

## 2023-05-05 NOTE — Progress Notes (Signed)
Acute Office Visit   Patient: Stanley Ritter   DOB: 01-29-1961   62 y.o. Male  MRN: [id number] Visit Date: 05/05/2023  Today's healthcare provider: Oswaldo Conroy Adeliz Tonkinson, PA-C  Introduced myself to the patient as a Secondary school teacher and provided education on APPs in clinical practice.    Chief Complaint  Patient presents with   Foot Injury    Patient says he broke his toe on Saturday and went to the [facility] walk in. Patient was advised to stay off of it and says his job does not accept work notes. Patient says he attempted to go to work and was unable to do so due his foot being swollen. Patient was told that he was told by his place of employment. He needed to come to in to see his PCP to have FMLA completed for this week until he see the foot specialist.    Subjective    HPI HPI     Foot Injury    Additional comments: Patient says he broke his toe on Saturday and went to the [facility] walk in. Patient was advised to stay off of it and says his job does not accept work notes. Patient says he attempted to go to work and was unable to do so due his foot being swollen. Patient was told that he was told by his place of employment. He needed to come to in to see his PCP to have FMLA completed for this week until he see the foot specialist.       Last edited by Malen Gauze, CMA on 05/05/2023  9:38 AM.       He states he is unable to get his foot in his work boots  The note he was provided with is not accepted by his employer  He states the FMLA paperwork was supposed to faxed to office for completion   He has an apt with Ortho on Monday for follow up    Medications: Outpatient Medications Prior to Visit  Medication Sig   Coenzyme Q10 (CO Q 10 PO) Take 1 tablet by mouth daily.   Multiple Vitamin (MULTI-VITAMINS) TABS Take 1 tablet by mouth daily.   Omega-3 Fatty Acids (FISH OIL PO) Take by mouth daily.   No facility-administered medications prior to visit.    Review of Systems   Musculoskeletal:        Left foot is in boot        Objective    BP 126/78   Pulse 69   Temp 98 F (36.7 C) (Oral)   Ht 5\' 9"  (1.753 m)   Wt 171 lb 12.8 oz (77.9 kg)   SpO2 98%   BMI 25.37 kg/m    Physical Exam Vitals reviewed.  Constitutional:      General: He is awake.     Appearance: Normal appearance. He is well-developed, well-groomed and normal weight.  HENT:     Head: Normocephalic and atraumatic.  Pulmonary:     Effort: Pulmonary effort is normal.  Musculoskeletal:     Cervical back: Normal range of motion.     Left foot: Decreased range of motion. No foot drop.  Neurological:     Mental Status: He is alert.  Psychiatric:        Behavior: Behavior is cooperative.       No results found for any visits on 05/05/23.  Assessment & Plan      No follow-ups on  file.        Problem List Items Addressed This Visit   None Visit Diagnoses     Nondisplaced fracture of proximal phalanx of left lesser toe(s), sequela    -  Primary Acute, ongoing concern  He was seen at [facility] for this and they are managing currently He has postop boot today and has been advised to refrain from prolonged standing, walking, no climbing ladders Reviewed [facility] notes today and completed FMLA paperwork for patient per request  Will defer to Ortho recommendations for management and plan Follow up as needed for persistent or progressing symptoms          No follow-ups on file.   I, Nashali Ditmer E Karsten Vaughn, PA-C, have reviewed all documentation for this visit. The documentation on 05/05/23 for the exam, diagnosis, procedures, and orders are all accurate and complete.   Jacquelin Hawking, MHS, PA-C [facility]

## 2023-05-05 NOTE — Telephone Encounter (Signed)
Patient was seen for an appointment  and stated that his job faxed FMLA paper work and would like a call when it is completed.paper work was printed and give to [employer] the provider that he saw today.Please advise.

## 2023-05-10 DIAGNOSIS — S92515A Nondisplaced fracture of proximal phalanx of left lesser toe(s), initial encounter for closed fracture: Secondary | ICD-10-CM | POA: Diagnosis not present

## 2023-09-28 ENCOUNTER — Encounter: Payer: Self-pay | Admitting: Family Medicine

## 2023-09-28 ENCOUNTER — Ambulatory Visit: Payer: BC Managed Care – PPO | Admitting: Family Medicine

## 2023-09-28 VITALS — BP 133/81 | HR 60 | Temp 97.8°F | Resp 16 | Wt 175.4 lb

## 2023-09-28 DIAGNOSIS — R030 Elevated blood-pressure reading, without diagnosis of hypertension: Secondary | ICD-10-CM

## 2023-09-28 DIAGNOSIS — Z23 Encounter for immunization: Secondary | ICD-10-CM

## 2023-09-28 DIAGNOSIS — H538 Other visual disturbances: Secondary | ICD-10-CM | POA: Diagnosis not present

## 2023-09-28 DIAGNOSIS — Z0000 Encounter for general adult medical examination without abnormal findings: Secondary | ICD-10-CM

## 2023-09-28 DIAGNOSIS — R109 Unspecified abdominal pain: Secondary | ICD-10-CM | POA: Diagnosis not present

## 2023-09-28 LAB — URINALYSIS, ROUTINE W REFLEX MICROSCOPIC
Bilirubin, UA: NEGATIVE
Glucose, UA: NEGATIVE
Ketones, UA: NEGATIVE
Leukocytes,UA: NEGATIVE
Nitrite, UA: NEGATIVE
Protein,UA: NEGATIVE
RBC, UA: NEGATIVE
Specific Gravity, UA: 1.01 (ref 1.005–1.030)
Urobilinogen, Ur: 0.2 mg/dL (ref 0.2–1.0)
pH, UA: 6.5 (ref 5.0–7.5)

## 2023-09-28 LAB — MICROALBUMIN, URINE WAIVED
Creatinine, Urine Waived: 50 mg/dL (ref 10–300)
Microalb, Ur Waived: 10 mg/L (ref 0–19)
Microalb/Creat Ratio: <30 mg/g (ref <30)

## 2023-09-28 LAB — BAYER DCA HB A1C WAIVED: HB A1C (BAYER DCA - WAIVED): 5.6 % (ref 4.8–5.6)

## 2023-09-28 NOTE — Progress Notes (Signed)
BP 133/81 (BP Location: Left Arm, Patient Position: Sitting, Cuff Size: Normal)   Pulse 60   Temp 97.8 F (36.6 C) (Oral)   Resp 16   Wt 175 lb 6.4 oz (79.6 kg)   SpO2 98%   BMI 25.90 kg/m    Subjective:    Patient ID: Stanley Ritter, male    DOB: 1961-08-19, 62 y.o.   MRN: [id number]  HPI: Stanley Ritter is a 62 y.o. male presenting on 09/28/2023 for comprehensive medical examination. Current medical complaints include:  ELEVATED BLOOD PRESSURE- had an issue yesterday where he felt funny and his vision was blurry for about an hour, BP was running high. He did have a whiplash injury from skating about 8 weeks ago and felt like he had a concussion. That has gotten a bit better.  Duration of elevated BP: unknown BP monitoring frequency: a few times a week BP range: 130s-140s systolic Previous BP meds: no Recent stressors: no Family history of hypertension: yes Recurrent headaches: no Visual changes: yes Palpitations: no  Dyspnea: no Chest pain: no Lower extremity edema: no Dizzy/lightheaded: yes Transient ischemic attacks: no  Has been having RUQ pain for a few months. Intermittent. Doesn't seem to be related to exercise. Has stopped vitamins to see if that helps. Has been cutting back on coffee. Better with less coffee. Unsure what makes it worse. No blood in his urine. No other symptoms.    Depression Screen done today and results listed below:     09/28/2023   10:32 AM 05/05/2023    9:42 AM 08/13/2022   11:28 AM 07/31/2022    2:55 PM 06/13/2021   10:12 AM  Depression screen PHQ 2/9  Decreased Interest 0 0 0 0 0  Down, Depressed, Hopeless 0 0 0 0 0  PHQ - 2 Score 0 0 0 0 0  Altered sleeping 0 2 2 2  0  Tired, decreased energy 0 0 1 2 0  Change in appetite 0 0 0 0 0  Feeling bad or failure about yourself  0 0 0 0 0  Trouble concentrating 0 0 0 0 0  Moving slowly or fidgety/restless 0 0 0 0 0  Suicidal thoughts 0 0 0 0 0  PHQ-9 Score 0 2 3 4  0  Difficult doing  work/chores Not difficult at all Not difficult at all Not difficult at all Not difficult at all     Past Medical History:  Past Medical History:  Diagnosis Date   Allergy    Dupuytren's contracture    Migraines     Surgical History:  Past Surgical History:  Procedure Laterality Date   COLONOSCOPY WITH PROPOFOL N/A 01/21/2023   Procedure: COLONOSCOPY WITH PROPOFOL;  Surgeon: Toney Reil, MD;  Location: [facility];  Service: Endoscopy;  Laterality: N/A;   dental implant     x2   HAND SURGERY Right 10/03/2021    Medications:  Current Outpatient Medications on File Prior to Visit  Medication Sig   Coenzyme Q10 (CO Q 10 PO) Take 1 tablet by mouth daily. (Patient not taking: Reported on 09/28/2023)   Multiple Vitamin (MULTI-VITAMINS) TABS Take 1 tablet by mouth daily. (Patient not taking: Reported on 09/28/2023)   Omega-3 Fatty Acids (FISH OIL PO) Take by mouth daily. (Patient not taking: Reported on 09/28/2023)   No current facility-administered medications on file prior to visit.    Allergies:  No Known Allergies  Social History:  Social History   Socioeconomic History  Marital status: Single    Spouse name: Not on file   Number of children: Not on file   Years of education: Not on file   Highest education level: Associate degree: occupational, technical, or vocational program  Occupational History   Not on file  Tobacco Use   Smoking status: Never   Smokeless tobacco: Never  Vaping Use   Vaping status: Never Used  Substance and Sexual Activity   Alcohol use: No    Alcohol/week: 0.0 standard drinks of alcohol   Drug use: No   Sexual activity: Not on file  Other Topics Concern   Not on file  Social History Narrative   Not on file   Social Determinants of Health   Financial Resource Strain: Low Risk  (09/27/2023)   Overall Financial Resource Strain (CARDIA)    Difficulty of Paying Living Expenses: Not hard at all  Food Insecurity: No Food  Insecurity (09/27/2023)   Hunger Vital Sign    Worried About Running Out of Food in the Last Year: Never true    Ran Out of Food in the Last Year: Never true  Transportation Needs: No Transportation Needs (09/27/2023)   PRAPARE - Administrator, Civil Service (Medical): No    Lack of Transportation (Non-Medical): No  Physical Activity: Sufficiently Active (09/27/2023)   Exercise Vital Sign    Days of Exercise per Week: 5 days    Minutes of Exercise per Session: 120 min  Stress: No Stress Concern Present (09/27/2023)   [employer] of Occupational Health - Occupational Stress Questionnaire    Feeling of Stress : Not at all  Social Connections: Moderately Integrated (09/27/2023)   Social Connection and Isolation Panel [NHANES]    Frequency of Communication with Friends and Family: More than three times a week    Frequency of Social Gatherings with Friends and Family: Three times a week    Attends Religious Services: 1 to 4 times per year    Active Member of Clubs or Organizations: Yes    Attends Banker Meetings: 1 to 4 times per year    Marital Status: Divorced  Catering manager Violence: Not on file   Social History   Tobacco Use  Smoking Status Never  Smokeless Tobacco Never   Social History   Substance and Sexual Activity  Alcohol Use No   Alcohol/week: 0.0 standard drinks of alcohol    Family History:  Family History  Problem Relation Age of Onset   Stroke Maternal Grandmother    Heart disease Mother    Glaucoma Mother    Hypertension Mother    Heart disease Father    Glaucoma Father    Hypertension Father    Hypertension Sister    Hypertension Brother    Diabetes Maternal Grandfather    Hypertension Brother     Past medical history, surgical history, medications, allergies, family history and social history reviewed with patient today and changes made to appropriate areas of the chart.   Review of Systems  Constitutional:   Positive for chills. Negative for diaphoresis, fever, malaise/fatigue and weight loss.  HENT: Negative.    Eyes: Negative.   Respiratory: Negative.    Cardiovascular: Negative.   Gastrointestinal:  Positive for heartburn. Negative for abdominal pain, blood in stool, constipation, diarrhea, melena, nausea and vomiting.  Genitourinary:  Positive for flank pain. Negative for dysuria, frequency, hematuria and urgency.  Musculoskeletal:  Negative for back pain, falls, joint pain, myalgias and neck pain.  Skin:  Negative.   Neurological: Negative.   Endo/Heme/Allergies:  Positive for environmental allergies. Negative for polydipsia. Does not bruise/bleed easily.  Psychiatric/Behavioral: Negative.     All other ROS negative except what is listed above and in the HPI.      Objective:    BP 133/81 (BP Location: Left Arm, Patient Position: Sitting, Cuff Size: Normal)   Pulse 60   Temp 97.8 F (36.6 C) (Oral)   Resp 16   Wt 175 lb 6.4 oz (79.6 kg)   SpO2 98%   BMI 25.90 kg/m   Wt Readings from Last 3 Encounters:  09/28/23 175 lb 6.4 oz (79.6 kg)  05/05/23 171 lb 12.8 oz (77.9 kg)  01/21/23 170 lb (77.1 kg)    Physical Exam Vitals and nursing note reviewed.  Constitutional:      General: He is not in acute distress.    Appearance: Normal appearance. He is obese. He is not ill-appearing, toxic-appearing or diaphoretic.  HENT:     Head: Normocephalic and atraumatic.     Right Ear: Ear canal and external ear normal. There is no impacted cerumen.     Left Ear: Tympanic membrane, ear canal and external ear normal. There is no impacted cerumen.     Ears:     Comments: Bulging R TM    Nose: Nose normal. No congestion or rhinorrhea.     Mouth/Throat:     Mouth: Mucous membranes are moist.     Pharynx: Oropharynx is clear. No oropharyngeal exudate or posterior oropharyngeal erythema.  Eyes:     General: No scleral icterus.       Right eye: No discharge.        Left eye: No discharge.      Extraocular Movements: Extraocular movements intact.     Right eye: Nystagmus present.     Conjunctiva/sclera: Conjunctivae normal.     Pupils: Pupils are equal, round, and reactive to light.  Neck:     Vascular: No carotid bruit.  Cardiovascular:     Rate and Rhythm: Normal rate and regular rhythm.     Pulses: Normal pulses.     Heart sounds: Normal heart sounds. No murmur heard.    No friction rub. No gallop.  Pulmonary:     Effort: Pulmonary effort is normal. No respiratory distress.     Breath sounds: Normal breath sounds. No stridor. No wheezing, rhonchi or rales.  Chest:     Chest wall: No tenderness.  Abdominal:     General: Abdomen is flat. Bowel sounds are normal. There is no distension.     Palpations: Abdomen is soft. There is no mass.     Tenderness: There is no abdominal tenderness. There is no right CVA tenderness, left CVA tenderness, guarding or rebound.     Hernia: No hernia is present.  Genitourinary:    Comments: Genital exam deferred with shared decision making Musculoskeletal:        General: No swelling, tenderness, deformity or signs of injury. Normal range of motion.     Cervical back: Normal range of motion and neck supple. No rigidity. No muscular tenderness.     Right lower leg: No edema.     Left lower leg: No edema.  Lymphadenopathy:     Cervical: No cervical adenopathy.  Skin:    General: Skin is warm and dry.     Capillary Refill: Capillary refill takes less than 2 seconds.     Coloration: Skin is not jaundiced or pale.  Findings: No bruising, erythema, lesion or rash.  Neurological:     General: No focal deficit present.     Mental Status: He is alert and oriented to person, place, and time. Mental status is at baseline.     Cranial Nerves: No cranial nerve deficit.     Sensory: No sensory deficit.     Motor: No weakness.     Coordination: Coordination normal.     Gait: Gait normal.     Deep Tendon Reflexes: Reflexes normal.  Psychiatric:         Mood and Affect: Mood normal.        Behavior: Behavior normal.        Thought Content: Thought content normal.        Judgment: Judgment normal.     Results for orders placed or performed in visit on 08/13/22  Comprehensive metabolic panel  Result Value Ref Range   Glucose 89 70 - 99 mg/dL   BUN 17 8 - 27 mg/dL   Creatinine, Ser 2.13 0.76 - 1.27 mg/dL   eGFR 75 >08 MV/HQI/6.96   BUN/Creatinine Ratio 15 10 - 24   Sodium 140 134 - 144 mmol/L   Potassium 4.2 3.5 - 5.2 mmol/L   Chloride 102 96 - 106 mmol/L   CO2 23 20 - 29 mmol/L   Calcium 9.2 8.6 - 10.2 mg/dL   Total Protein 6.7 6.0 - 8.5 g/dL   Albumin 4.7 3.8 - 4.9 g/dL   Globulin, Total 2.0 1.5 - 4.5 g/dL   Albumin/Globulin Ratio 2.4 (H) 1.2 - 2.2   Bilirubin Total 0.8 0.0 - 1.2 mg/dL   Alkaline Phosphatase 96 44 - 121 IU/L   AST 87 (H) 0 - 40 IU/L   ALT 51 (H) 0 - 44 IU/L  CBC with Differential/Platelet  Result Value Ref Range   WBC 4.7 3.4 - 10.8 x10E3/uL   RBC 5.37 4.14 - 5.80 x10E6/uL   Hemoglobin 16.2 13.0 - 17.7 g/dL   Hematocrit 29.5 28.4 - 51.0 %   MCV 90 79 - 97 fL   MCH 30.2 26.6 - 33.0 pg   MCHC 33.5 31.5 - 35.7 g/dL   RDW 13.2 44.0 - 10.2 %   Platelets 218 150 - 450 x10E3/uL   Neutrophils 55 Not Estab. %   Lymphs 35 Not Estab. %   Monocytes 8 Not Estab. %   Eos 1 Not Estab. %   Basos 1 Not Estab. %   Neutrophils Absolute 2.6 1.4 - 7.0 x10E3/uL   Lymphocytes Absolute 1.6 0.7 - 3.1 x10E3/uL   Monocytes Absolute 0.4 0.1 - 0.9 x10E3/uL   EOS (ABSOLUTE) 0.1 0.0 - 0.4 x10E3/uL   Basophils Absolute 0.0 0.0 - 0.2 x10E3/uL   Immature Granulocytes 0 Not Estab. %   Immature Grans (Abs) 0.0 0.0 - 0.1 x10E3/uL  Lipid Panel w/o Chol/HDL Ratio  Result Value Ref Range   Cholesterol, Total 203 (H) 100 - 199 mg/dL   Triglycerides 71 0 - 149 mg/dL   HDL 53 >72 mg/dL   VLDL Cholesterol Cal 13 5 - 40 mg/dL   LDL Chol Calc (NIH) 536 (H) 0 - 99 mg/dL  PSA  Result Value Ref Range   Prostate Specific Ag, Serum  0.6 0.0 - 4.0 ng/mL  TSH  Result Value Ref Range   TSH 1.640 0.450 - 4.500 uIU/mL  Urinalysis, Routine w reflex microscopic  Result Value Ref Range   Specific Gravity, UA 1.015 1.005 - 1.030   pH,  UA 7.0 5.0 - 7.5   Color, UA Yellow Yellow   Appearance Ur Clear Clear   Leukocytes,UA Negative Negative   Protein,UA Negative Negative/Trace   Glucose, UA Negative Negative   Ketones, UA Negative Negative   RBC, UA Negative Negative   Bilirubin, UA Negative Negative   Urobilinogen, Ur 0.2 0.2 - 1.0 mg/dL   Nitrite, UA Negative Negative  HIV Antibody (routine testing w rflx)  Result Value Ref Range   HIV Screen 4th Generation wRfx Non Reactive Non Reactive  Hepatitis C Antibody  Result Value Ref Range   Hep C Virus Ab Non Reactive Non Reactive  VITAMIN D 25 Hydroxy (Vit-D Deficiency, Fractures)  Result Value Ref Range   Vit D, 25-Hydroxy 46.0 30.0 - 100.0 ng/mL      Assessment & Plan:   Problem List Items Addressed This Visit   None Visit Diagnoses     Routine general medical examination at a health care facility    -  Primary   Vaccines up to date. Screenign lab checked today. Colonoscopy up to date. Continue diet and exercise. Call with any concerns.   Relevant Orders   Comprehensive metabolic panel   CBC with Differential/Platelet   Lipid Panel w/o Chol/HDL Ratio   PSA   TSH   Flank pain       Concern for kidney/ureter issue due to improvement cutting coffee. Will check labs and UA. Await results. May need [place].   Relevant Orders   Urinalysis, Routine w reflex microscopic   Elevated blood pressure reading       Better today. Will check microalbumin and labs. Will get cuff and monitor at home. Call with any concerns. Recheck in about a month.   Relevant Orders   Microalbumin, Urine Waived   Blurred vision       ? due to veritgo due to nystagmus. Checking labs today. Await results. Start epley's manuever. Recheck 1 month.   Relevant Orders   Bayer DCA Hb A1c Waived    Need for vaccination       Flu shot given today.   Relevant Orders   Flu vaccine trivalent PF, 6mos and older(Flulaval,Afluria,Fluarix,Fluzone)       LABORATORY TESTING:  Health maintenance labs ordered today as discussed above.   The natural history of prostate cancer and ongoing controversy regarding screening and potential treatment outcomes of prostate cancer has been discussed with the patient. The meaning of a false positive PSA and a false negative PSA has been discussed. He indicates understanding of the limitations of this screening test and wishes to proceed with screening PSA testing.   IMMUNIZATIONS:   - Tdap: Tetanus vaccination status reviewed: last tetanus booster within 10 years. - Influenza: Administered today - Pneumovax: Not applicable - Prevnar: Not applicable - COVID: Refused - HPV: Not applicable - Shingrix vaccine: Up to date  SCREENING: - Colonoscopy: Up to date  Discussed with patient purpose of the colonoscopy is to detect colon cancer at curable precancerous or early stages   PATIENT COUNSELING:    Sexuality: Discussed sexually transmitted diseases, partner selection, use of condoms, avoidance of unintended pregnancy  and contraceptive alternatives.   Advised to avoid cigarette smoking.  I discussed with the patient that most people either abstain from alcohol or drink within safe limits (<=14/week and <=4 drinks/occasion for males, <=7/weeks and <= 3 drinks/occasion for females) and that the risk for alcohol disorders and other health effects rises proportionally with the number of drinks per week and  how often a drinker exceeds daily limits.  Discussed cessation/primary prevention of drug use and availability of treatment for abuse.   Diet: Encouraged to adjust caloric intake to maintain  or achieve ideal body weight, to reduce intake of dietary saturated fat and total fat, to limit sodium intake by avoiding high sodium foods and not adding table salt,  and to maintain adequate dietary potassium and calcium preferably from fresh fruits, vegetables, and low-fat dairy products.    stressed the importance of regular exercise  Injury prevention: Discussed safety belts, safety helmets, smoke detector, smoking near bedding or upholstery.   Dental health: Discussed importance of regular tooth brushing, flossing, and dental visits.   Follow up plan: NEXT PREVENTATIVE PHYSICAL DUE IN 1 YEAR. Return in about 4 weeks (around 10/26/2023).

## 2023-09-29 LAB — LIPID PANEL W/O CHOL/HDL RATIO
Cholesterol, Total: 202 mg/dL — ABNORMAL HIGH (ref 100–199)
HDL: 53 mg/dL (ref >39)
LDL Chol Calc (NIH): 135 mg/dL — ABNORMAL HIGH (ref 0–99)
Triglycerides: 75 mg/dL (ref 0–149)
VLDL Cholesterol Cal: 14 mg/dL (ref 5–40)

## 2023-09-29 LAB — CBC WITH DIFFERENTIAL/PLATELET
Basophils Absolute: 0.1 10*3/uL (ref 0.0–0.2)
Basos: 1 %
EOS (ABSOLUTE): 0.1 10*3/uL (ref 0.0–0.4)
Eos: 1 %
Hematocrit: 51.2 % — ABNORMAL HIGH (ref 37.5–51.0)
Hemoglobin: 16.5 g/dL (ref 13.0–17.7)
Immature Grans (Abs): 0 10*3/uL (ref 0.0–0.1)
Immature Granulocytes: 0 %
Lymphocytes Absolute: 2 10*3/uL (ref 0.7–3.1)
Lymphs: 35 %
MCH: 29.4 pg (ref 26.6–33.0)
MCHC: 32.2 g/dL (ref 31.5–35.7)
MCV: 91 fL (ref 79–97)
Monocytes Absolute: 0.4 10*3/uL (ref 0.1–0.9)
Monocytes: 8 %
Neutrophils Absolute: 3.1 10*3/uL (ref 1.4–7.0)
Neutrophils: 55 %
Platelets: 264 10*3/uL (ref 150–450)
RBC: 5.62 x10E6/uL (ref 4.14–5.80)
RDW: 12.5 % (ref 11.6–15.4)
WBC: 5.7 10*3/uL (ref 3.4–10.8)

## 2023-09-29 LAB — TSH: TSH: 2.98 u[IU]/mL (ref 0.450–4.500)

## 2023-09-29 LAB — COMPREHENSIVE METABOLIC PANEL
ALT: 21 [IU]/L (ref 0–44)
AST: 29 [IU]/L (ref 0–40)
Albumin: 4.6 g/dL (ref 3.9–4.9)
Alkaline Phosphatase: 111 [IU]/L (ref 44–121)
BUN/Creatinine Ratio: 10 (ref 10–24)
BUN: 12 mg/dL (ref 8–27)
Bilirubin Total: 0.7 mg/dL (ref 0.0–1.2)
CO2: 23 mmol/L (ref 20–29)
Calcium: 9.3 mg/dL (ref 8.6–10.2)
Chloride: 102 mmol/L (ref 96–106)
Creatinine, Ser: 1.18 mg/dL (ref 0.76–1.27)
Globulin, Total: 2 g/dL (ref 1.5–4.5)
Glucose: 92 mg/dL (ref 70–99)
Potassium: 4.2 mmol/L (ref 3.5–5.2)
Sodium: 140 mmol/L (ref 134–144)
Total Protein: 6.6 g/dL (ref 6.0–8.5)
eGFR: 70 mL/min/{1.73_m2} (ref >59)

## 2023-09-29 LAB — PSA: Prostate Specific Ag, Serum: 0.8 ng/mL (ref 0.0–4.0)

## 2023-10-22 ENCOUNTER — Ambulatory Visit: Payer: BC Managed Care – PPO | Admitting: Family Medicine

## 2024-07-24 ENCOUNTER — Encounter: Payer: Self-pay | Admitting: Internal Medicine

## 2024-07-24 ENCOUNTER — Ambulatory Visit: Payer: Self-pay

## 2024-07-24 ENCOUNTER — Ambulatory Visit: Admitting: Internal Medicine

## 2024-07-24 VITALS — BP 128/72 | HR 73 | Temp 99.1°F | Ht 69.0 in | Wt 175.0 lb

## 2024-07-24 DIAGNOSIS — U071 COVID-19: Secondary | ICD-10-CM | POA: Diagnosis not present

## 2024-07-24 DIAGNOSIS — R053 Chronic cough: Secondary | ICD-10-CM

## 2024-07-24 LAB — POC COVID19/FLU A&B COMBO
Covid Antigen, POC: POSITIVE — AB
Influenza A Antigen, POC: NEGATIVE
Influenza B Antigen, POC: NEGATIVE

## 2024-07-24 MED ORDER — PROMETHAZINE-DM 6.25-15 MG/5ML PO SYRP: 5.0000 mL | ORAL_SOLUTION | Freq: Four times a day (QID) | ORAL | 0 refills | Status: AC | PRN

## 2024-07-24 NOTE — Patient Instructions (Signed)
 Take Tylenol  650 - 1000 mg every 6-8 hours for fever, body aches and headache. Drink plenty of fluids with electrolytes. Monitor for fever that does not decrease and/or shortness of breath that worsens or is present at rest. Quarantine for 5 days. After 5 days can be in the community wearing a mask for 5 more day.

## 2024-07-24 NOTE — Progress Notes (Signed)
 Date:  07/24/2024   Name:  Stanley Ritter   DOB:  1961/05/09   MRN:  [id number]   Chief Complaint: Fever (Fever, cough, SOB.)  Fever  This is a new problem. The current episode started yesterday. The maximum temperature noted was 100 to 100.9 F. Associated symptoms include congestion, coughing and headaches. Pertinent negatives include no chest pain or wheezing.  Cough This is a new problem. The current episode started yesterday. The problem occurs every few minutes. The cough is Non-productive. Associated symptoms include chills, a fever, headaches and shortness of breath. Pertinent negatives include no chest pain or wheezing.    Review of Systems  Constitutional:  Positive for chills and fever.  HENT:  Positive for congestion.   Respiratory:  Positive for cough, chest tightness and shortness of breath. Negative for wheezing.   Cardiovascular:  Negative for chest pain.  Neurological:  Positive for headaches.     Lab Results  Component Value Date   NA 140 09/28/2023   K 4.2 09/28/2023   CO2 23 09/28/2023   GLUCOSE 92 09/28/2023   BUN 12 09/28/2023   CREATININE 1.18 09/28/2023   CALCIUM 9.3 09/28/2023   EGFR 70 09/28/2023   GFRNONAA 64 11/19/2020   Lab Results  Component Value Date   CHOL 202 (H) 09/28/2023   HDL 53 09/28/2023   LDLCALC 135 (H) 09/28/2023   TRIG 75 09/28/2023   CHOLHDL 3.5 06/13/2021   Lab Results  Component Value Date   TSH 2.980 09/28/2023   Lab Results  Component Value Date   HGBA1C 5.6 09/28/2023   Lab Results  Component Value Date   WBC 5.7 09/28/2023   HGB 16.5 09/28/2023   HCT 51.2 (H) 09/28/2023   MCV 91 09/28/2023   PLT 264 09/28/2023   Lab Results  Component Value Date   ALT 21 09/28/2023   AST 29 09/28/2023   GGT 13 11/19/2020   ALKPHOS 111 09/28/2023   BILITOT 0.7 09/28/2023   Lab Results  Component Value Date   VD25OH 46.0 08/13/2022     Patient Active Problem List   Diagnosis Date Noted   Encounter for screening  colonoscopy 01/21/2023   Callus of foot 12/04/2020   Dupuytren's contracture 09/19/2015    No Known Allergies  Past Surgical History:  Procedure Laterality Date   COLONOSCOPY WITH PROPOFOL  N/A 01/21/2023   Procedure: COLONOSCOPY WITH PROPOFOL ;  Surgeon: Unk Corinn Skiff, MD;  Location: [facility];  Service: Endoscopy;  Laterality: N/A;   dental implant     x2   HAND SURGERY Right 10/03/2021    Social History   Tobacco Use   Smoking status: Never   Smokeless tobacco: Never  Vaping Use   Vaping status: Never Used  Substance Use Topics   Alcohol use: No    Alcohol/week: 0.0 standard drinks of alcohol   Drug use: No     Medication list has been reviewed and updated.  Current Meds  Medication Sig   promethazine -dextromethorphan (PROMETHAZINE -DM) 6.25-15 MG/5ML syrup Take 5 mLs by mouth 4 (four) times daily as needed for up to 9 days for cough.       07/24/2024    9:20 AM 09/28/2023   10:32 AM 05/05/2023    9:42 AM 08/13/2022   11:28 AM  GAD 7 : Generalized Anxiety Score  Nervous, Anxious, on Edge 0 0 0 0  Control/stop worrying 0 0 0 0  Worry too much - different things 0 0 0 0  Trouble relaxing 0 0 1 0  Restless 0 0 0 0  Easily annoyed or irritable 0 0 0 0  Afraid - awful might happen 0 0 0 0  Total GAD 7 Score 0 0 1 0  Anxiety Difficulty Not difficult at all Not difficult at all Not difficult at all Not difficult at all       07/24/2024    9:20 AM 09/28/2023   10:32 AM 05/05/2023    9:42 AM  Depression screen PHQ 2/9  Decreased Interest 0 0 0  Down, Depressed, Hopeless 0 0 0  PHQ - 2 Score 0 0 0  Altered sleeping 0 0 2  Tired, decreased energy 0 0 0  Change in appetite 0 0 0  Feeling bad or failure about yourself  0 0 0  Trouble concentrating 0 0 0  Moving slowly or fidgety/restless 0 0 0  Suicidal thoughts 0 0 0  PHQ-9 Score 0 0 2  Difficult doing work/chores Not difficult at all Not difficult at all Not difficult at all    BP Readings from  Last 3 Encounters:  07/24/24 128/72  09/28/23 133/81  05/05/23 126/78    Physical Exam Vitals and nursing note reviewed.  Constitutional:      General: He is not in acute distress.    Appearance: He is well-developed. He is ill-appearing.  HENT:     Head: Normocephalic and atraumatic.  Cardiovascular:     Rate and Rhythm: Normal rate and regular rhythm.     Heart sounds: No murmur heard. Pulmonary:     Effort: Pulmonary effort is normal. No respiratory distress.     Breath sounds: No wheezing or rhonchi.     Comments: Frequent dry cough Musculoskeletal:     Cervical back: Normal range of motion.     Right lower leg: No edema.     Left lower leg: No edema.  Lymphadenopathy:     Cervical: No cervical adenopathy.  Skin:    General: Skin is warm and dry.     Findings: No rash.  Neurological:     Mental Status: He is alert and oriented to person, place, and time.  Psychiatric:        Mood and Affect: Mood normal.        Behavior: Behavior normal.     Wt Readings from Last 3 Encounters:  07/24/24 175 lb (79.4 kg)  09/28/23 175 lb 6.4 oz (79.6 kg)  05/05/23 171 lb 12.8 oz (77.9 kg)    BP 128/72   Pulse 73   Temp 99.1 F (37.3 C) (Oral)   Ht 5' 9 (1.753 m)   Wt 175 lb (79.4 kg)   SpO2 100%   BMI 25.84 kg/m   Assessment and Plan:  Problem List Items Addressed This Visit   None Visit Diagnoses       COVID-19 virus infection    -  Primary   low risk for complications so will treat conservatively with tylenol , fluids and cough suppressants Note for work through 07/28/24 given   Relevant Medications   promethazine -dextromethorphan (PROMETHAZINE -DM) 6.25-15 MG/5ML syrup     Persistent dry cough       Relevant Orders   POC Covid19/Flu A&B Antigen (Completed)      He may need STD from [facility] since he will be out > 3 days.  He plans to reach out to his PCP for this paperwork.  No follow-ups on file.    Leita HILARIO Adie, MD [facility] [place]

## 2024-07-24 NOTE — Telephone Encounter (Signed)
 FYI Only or Action Required?: FYI only for provider.  Patient was last seen in primary care on 09/28/2023 by Vicci Duwaine SQUIBB, DO.  Called Nurse Triage reporting Shortness of Breath.  Symptoms began X Saturday.  Interventions attempted: Nothing.  Symptoms are: gradually worsening.  Triage Disposition: See HCP Within 4 Hours (Or PCP Triage)  Patient/caregiver understands and will follow disposition?: Yes   FYI Only or Action Required?: FYI only for provider.  Patient was last seen in primary care on 09/28/2023 by Vicci Duwaine SQUIBB, DO.  Called Nurse Triage reporting Shortness of Breath.  Symptoms began X Saturday.  Interventions attempted: Nothing.  Symptoms are: gradually worsening.  Triage Disposition: See HCP Within 4 Hours (Or PCP Triage)  Patient/caregiver understands and will follow disposition?: YesCopied from CRM [id number]. Topic: Clinical - Red Word Triage >> Jul 24, 2024  8:03 AM Treva T wrote: Red Word that prompted transfer to Nurse Triage: Patient reports worsening dry cough, and increased shortness of breath. Reason for Disposition  [1] MILD difficulty breathing (e.g., minimal/no SOB at rest, SOB with walking, pulse < 100) AND [2] still present when not coughing  [1] MILD difficulty breathing (e.g., minimal/no SOB at rest, SOB with walking, pulse < 100) AND [2] NEW-onset or WORSE than normal  Answer Assessment - Initial Assessment Questions 1. RESPIRATORY STATUS: Describe your breathing? (e.g., wheezing, shortness of breath, unable to speak, severe coughing)      SOB 2. ONSET: When did this breathing problem begin?      Saturday 3. PATTERN Does the difficult breathing come and go, or has it been constant since it started?      Constant  4. SEVERITY: How bad is your breathing? (e.g., mild, moderate, severe)      Moderate to severe 5. RECURRENT SYMPTOM: Have you had difficulty breathing before? If Yes, ask: When was the last time? and What happened  that time?      no 6. CARDIAC HISTORY: Do you have any history of heart disease? (e.g., heart attack, angina, bypass surgery, angioplasty)      no 7. LUNG HISTORY: Do you have any history of lung disease?  (e.g., pulmonary embolus, asthma, emphysema)     no 8. CAUSE: What do you think is causing the breathing problem?      no 9. OTHER SYMPTOMS: Do you have any other symptoms? (e.g., chest pain, cough, dizziness, fever, runny nose)     Chest pain on Saturday, cough, 10. O2 SATURATION MONITOR:  Do you use an oxygen saturation monitor (pulse oximeter) at home? If Yes, ask: What is your reading (oxygen level) today? What is your usual oxygen saturation reading? (e.g., 95%)       95 today: O2 Saturday evening was in 80's 11. PREGNANCY: Is there any chance you are pregnant? When was your last menstrual period?       na 12. TRAVEL: Have you traveled out of the country in the last month? (e.g., travel history, exposures)       [place]  101[place]3 fever today and been there since, Saturday  Answer Assessment - Initial Assessment Questions 1. ONSET: When did the cough begin?      Saturday 2. SEVERITY: How bad is the cough today?      moderate 3. SPUTUM: Describe the color of your sputum (e.g., none, dry cough; clear, white, yellow, green)     Dry cough 4. HEMOPTYSIS: Are you coughing up any blood? If Yes, ask: How much? (e.g., flecks, streaks, tablespoons, etc.)  no 5. DIFFICULTY BREATHING: Are you having difficulty breathing? If Yes, ask: How bad is it? (e.g., mild, moderate, severe)      Moderate to severe at times 6. FEVER: Do you have a fever? If Yes, ask: What is your temperature, how was it measured, and when did it start?     yes 7. CARDIAC HISTORY: Do you have any history of heart disease? (e.g., heart attack, congestive heart failure)      no 8. LUNG HISTORY: Do you have any history of lung disease?  (e.g., pulmonary embolus, asthma, emphysema)      no 9. PE RISK FACTORS: Do you have a history of blood clots? (or: recent major surgery, recent prolonged travel, bedridden)     no 10. OTHER SYMPTOMS: Do you have any other symptoms? (e.g., runny nose, wheezing, chest pain)       Overall not feeling well 11. PREGNANCY: Is there any chance you are pregnant? When was your last menstrual period?       na 12. TRAVEL: Have you traveled out of the country in the last month? (e.g., travel history, exposures)      no  Protocols used: Breathing Difficulty-A-AH, Cough - Acute Non-Productive-A-AH
